# Patient Record
Sex: Male | Born: 1950 | Hispanic: No | Marital: Single | Smoking: Never smoker
Health system: Southern US, Community
[De-identification: ages and names within clinical notes are randomized; demographics above are authoritative.]

## PROBLEM LIST (undated history)

## (undated) DIAGNOSIS — Z9289 Personal history of other medical treatment: Secondary | ICD-10-CM

## (undated) DIAGNOSIS — N529 Male erectile dysfunction, unspecified: Secondary | ICD-10-CM

## (undated) DIAGNOSIS — R972 Elevated prostate specific antigen [PSA]: Secondary | ICD-10-CM

## (undated) DIAGNOSIS — J479 Bronchiectasis, uncomplicated: Secondary | ICD-10-CM

## (undated) DIAGNOSIS — E785 Hyperlipidemia, unspecified: Secondary | ICD-10-CM

## (undated) DIAGNOSIS — Z8042 Family history of malignant neoplasm of prostate: Secondary | ICD-10-CM

## (undated) HISTORY — DX: Personal history of other medical treatment: Z92.89

## (undated) HISTORY — DX: Bronchiectasis, uncomplicated: J47.9

## (undated) HISTORY — DX: Hyperlipidemia, unspecified: E78.5

## (undated) HISTORY — DX: Family history of malignant neoplasm of prostate: Z80.42

## (undated) HISTORY — PX: NO PAST SURGERIES: SHX2092

## (undated) HISTORY — DX: Elevated prostate specific antigen (PSA): R97.20

## (undated) HISTORY — DX: Male erectile dysfunction, unspecified: N52.9

---

## 2002-12-20 ENCOUNTER — Ambulatory Visit (HOSPITAL_COMMUNITY): Admission: RE | Admit: 2002-12-20 | Discharge: 2002-12-20 | Payer: Self-pay | Admitting: Gastroenterology

## 2003-04-29 ENCOUNTER — Emergency Department (HOSPITAL_COMMUNITY): Admission: EM | Admit: 2003-04-29 | Discharge: 2003-04-30 | Payer: Self-pay

## 2012-09-28 DIAGNOSIS — Z9289 Personal history of other medical treatment: Secondary | ICD-10-CM | POA: Insufficient documentation

## 2012-09-28 HISTORY — DX: Personal history of other medical treatment: Z92.89

## 2013-10-12 ENCOUNTER — Encounter: Payer: Self-pay | Admitting: Cardiology

## 2013-10-12 DIAGNOSIS — R011 Cardiac murmur, unspecified: Secondary | ICD-10-CM | POA: Insufficient documentation

## 2013-10-12 HISTORY — DX: Cardiac murmur, unspecified: R01.1

## 2015-02-06 ENCOUNTER — Institutional Professional Consult (permissible substitution): Payer: Self-pay | Admitting: Internal Medicine

## 2015-02-06 ENCOUNTER — Encounter: Payer: Self-pay | Admitting: Internal Medicine

## 2015-02-06 ENCOUNTER — Ambulatory Visit (INDEPENDENT_AMBULATORY_CARE_PROVIDER_SITE_OTHER): Payer: Managed Care, Other (non HMO) | Admitting: Internal Medicine

## 2015-02-06 VITALS — BP 104/60 | HR 80 | Ht 68.0 in | Wt 124.0 lb

## 2015-02-06 DIAGNOSIS — J479 Bronchiectasis, uncomplicated: Secondary | ICD-10-CM

## 2015-02-06 DIAGNOSIS — J471 Bronchiectasis with (acute) exacerbation: Secondary | ICD-10-CM | POA: Diagnosis not present

## 2015-02-06 HISTORY — DX: Bronchiectasis, uncomplicated: J47.9

## 2015-02-06 MED ORDER — MOMETASONE FURO-FORMOTEROL FUM 100-5 MCG/ACT IN AERO: INHALATION_SPRAY | RESPIRATORY_TRACT | Status: DC

## 2015-02-06 MED ORDER — AMOXICILLIN-POT CLAVULANATE 875-125 MG PO TABS: 1.0000 | ORAL_TABLET | Freq: Two times a day (BID) | ORAL | Status: DC

## 2015-02-06 NOTE — Progress Notes (Signed)
   Subjective:    Patient ID: Henry Roth, male    DOB: Oct 13, 1950,     MRN: [id number]  HPI  64 yo [place] male dx bronchiectasis early 2000s with reurrent flares once in Dec 2015 and then again 12/2014 referred to pulmonary clinic 02/06/2015 by Dr Catha Gosselin  p   course of  Levaquin   Completed x 10 days on 01/30/15       02/06/2015 1st [facility] office visit/ Wert   Chief Complaint  Patient presents with  . Pulmonary Consult    Referred by Dr. Catha Gosselin. Pt c/o cough for the past 5-6 yrs, worse x 1 yr. Cough is prod with bloody sputum.    cough was much better p 10 levaquin then worse again since 02/03/15  With yellow and  Bloody mucus esp in am but no assoc sob. Pattern has been recurrent sev times yearly x 15 years or so. Assoc with minimal actual hemoptysis and no sob or cp/ worse in cold weather otherwise no obvious   patterns in day to day or daytime variabilty or assoc  chest tightness, subjective wheeze overt sinus or hb symptoms. No unusual exp hx or h/o childhood pna/ asthma or knowledge of premature birth.  Sleeping ok without nocturnal  or early am exacerbation  of respiratory  c/o's or need for noct saba. Also denies any obvious fluctuation of symptoms with weather or environmental changes or other aggravating or alleviating factors except as outlined above   Current Medications, Allergies, Complete Past Medical History, Past Surgical History, Family History, and Social History were reviewed in [employer] record.            Review of Systems  Constitutional: Negative for fever, chills, activity change, appetite change and unexpected weight change.  HENT: Negative for congestion, dental problem, postnasal drip, rhinorrhea, sneezing, sore throat, trouble swallowing and voice change.   Eyes: Negative for visual disturbance.  Respiratory: Positive for cough. Negative for choking and shortness of breath.   Cardiovascular: Negative for chest  pain and leg swelling.  Gastrointestinal: Negative for nausea, vomiting and abdominal pain.  Genitourinary: Negative for difficulty urinating.  Musculoskeletal: Negative for arthralgias.  Skin: Negative for rash.  Psychiatric/Behavioral: Negative for behavioral problems and confusion.       Objective:   Physical Exam   amb ethopian male nad very hoarse/ nasal  Tone to vocie   Wt Readings from Last 3 Encounters:  02/06/15 124 lb (56.246 kg)    Vital signs reviewed   HEENT: nl dentition, turbinates, and orophanx. Nl external ear canals without cough reflex   NECK :  without JVD/Nodes/TM/ nl carotid upstrokes bilaterally   LUNGS: no acc muscle use, insp / exp rhonchi bilaterally    CV:  RRR  no s3 or murmur or increase in P2, no edema   ABD:  soft and nontender with nl excursion in the supine position. No bruits or organomegaly, bowel sounds nl  MS:  warm without deformities, calf tenderness, cyanosis or clubbing  SKIN: warm and dry without lesions    NEURO:  alert, approp, no deficits    I personally reviewed images and agree with radiology impression as follows:  CXR:  01/19/15 Increased density both lung bases              Assessment & Plan:

## 2015-02-06 NOTE — Assessment & Plan Note (Signed)
EPIC review indicates no CT on record but I suspect strongly that he not only has bronchiectasis but airflow obstruction clinically so he has obstructive bronchiectasis and is at risk for resistant organisms and MAI infection. What he needs now is better mucociliary stimulation and I recommended a course of dulera 100 2 puffs twice a day for this and treatment for what may be underlying sinusitis as well as bronchiectasis with Augmentinx 10 days  and then return to schedule sinus and high-resolution CT scans to complete the workup.  If he does have significant airflow obstruction he also needs palpable 1 antitrypsin genotyping, although I understand this would be rare in the African or  Asian population.  I had an extended discussion with the patient reviewing all relevant studies completed to date and  lasting 35 m   Each maintenance medication was reviewed in detail including most importantly the difference between maintenance and prns and under what circumstances the prns are to be triggered using an action plan format that is not reflected in the computer generated alphabetically organized AVS.    Please see instructions for details which were reviewed in writing and the patient given a copy highlighting the part that I personally wrote and discussed at today's ov.

## 2015-02-06 NOTE — Patient Instructions (Addendum)
Augmentin 875 mg take one pill twice daily  X 10 days - take at breakfast and supper with large glass of water.  It would help reduce the usual side effects (diarrhea and yeast infections) if you ate cultured yogurt at lunch.   Dulera 100 Take 2 puffs first thing in am and then another 2 puffs about 12 hours later.   Work on inhaler technique:  relax and gently blow all the way out then take a nice smooth deep breath back in, triggering the inhaler at same time you start breathing in.  Hold for up to 5 seconds if you can. Blow out thru nose. Rinse and gargle with water when done  Bronchiectasis =   you have scarring of your bronchial tubes which means that they don't function perfectly normally and mucus tends to pool in certain areas of your lung which can cause pneumonia and further scarring of your lung and bronchial tubes  Whenever you develop cough congestion take mucinex or mucinex dm > these will help keep the mucus loose and flowing but if your condition worsens you need to seek help immediately preferably here or somewhere inside the [facility] system to compare xrays ( worse = darker or bloody mucus or pain on breathing in)    Please schedule a follow up office visit in 2 weeks, sooner if needed  Late add Needs sinus and HRCT chest to complete the w/u

## 2015-02-20 ENCOUNTER — Encounter: Payer: Self-pay | Admitting: Internal Medicine

## 2015-02-20 ENCOUNTER — Ambulatory Visit (INDEPENDENT_AMBULATORY_CARE_PROVIDER_SITE_OTHER): Payer: Managed Care, Other (non HMO) | Admitting: Internal Medicine

## 2015-02-20 DIAGNOSIS — J471 Bronchiectasis with (acute) exacerbation: Secondary | ICD-10-CM

## 2015-02-20 DIAGNOSIS — J479 Bronchiectasis, uncomplicated: Secondary | ICD-10-CM

## 2015-02-20 MED ORDER — MOMETASONE FURO-FORMOTEROL FUM 100-5 MCG/ACT IN AERO: INHALATION_SPRAY | RESPIRATORY_TRACT | Status: DC

## 2015-02-20 NOTE — Progress Notes (Signed)
Subjective:    Patient ID: Henry Roth, male    DOB: 06/06/1950,     MRN: [id number]    Brief patient profile:  64 yo [place] male dx bronchiectasis early 2000s with recurrent flares once in Dec 2015 and then again 12/2014 referred to pulmonary clinic 02/06/2015 by Dr Catha Gosselin  p course of  Levaquin   Completed x 10 days on 01/30/15       02/06/2015 1st [facility] office visit/ Amarilis Belflower   Chief Complaint  Patient presents with  . Pulmonary Consult    Referred by Dr. Catha Gosselin. Pt c/o cough for the past 5-6 yrs, worse x 1 yr. Cough is prod with bloody sputum.    cough was much better p 10 levaquin then worse again since 02/03/15  With yellow and  Bloody mucus esp in am but no assoc sob. Pattern has been recurrent sev times yearly x 15 years or so. Assoc with minimal actual hemoptysis and no sob or cp/ worse in cold weather otherwise no obvious   patterns in day to day or daytime variabilty or assoc  chest tightness, subjective wheeze overt sinus or hb symptoms. No unusual exp hx or h/o childhood pna/ asthma or knowledge of premature birth. rec Augmentin 875 mg take one pill twice daily  X 10 days - Dulera 100 Take 2 puffs first thing in am and then another 2 puffs about 12 hours later.  Work on inhaler technique:        02/20/2015  f/u ov/Rhyanna Sorce re: suspected obst bronchiectasis / on just dulera 100 2bid  Chief Complaint  Patient presents with  . Follow-up    Pt states cough is much improved since the last visit. No new co's today.      no purulent or bloody mucus, just mucoid x sev tsp mostly in ams  Not limited by breathing from desired activities  But not aerobically active   No obvious day to day or daytime variability or assoc   cp or chest tightness, subjective wheeze or overt   hb symptoms. No unusual exp hx or h/o childhood pna/ asthma or knowledge of premature birth.  Sleeping ok without nocturnal  or early am exacerbation  of respiratory  c/o's or need for noct  saba. Also denies any obvious fluctuation of symptoms with weather or environmental changes or other aggravating or alleviating factors except as outlined above   Current Medications, Allergies, Complete Past Medical History, Past Surgical History, Family History, and Social History were reviewed in [employer] record.  ROS  The following are not active complaints unless bolded sore throat, dysphagia, dental problems, itching, sneezing,  nasal congestion or excess/ purulent secretions, ear ache,   fever, chills, sweats, unintended wt loss, classically pleuritic or exertional cp, hemoptysis,  orthopnea pnd or leg swelling, presyncope, palpitations, abdominal pain, anorexia, nausea, vomiting, diarrhea  or change in bowel or bladder habits, change in stools or urine, dysuria,hematuria,  rash, arthralgias, visual complaints, headache, numbness, weakness or ataxia or problems with walking or coordination,  change in mood/affect or memory.               Objective:   Physical Exam   amb ethopian male nad mildly hoarse/ nasal  Tone to vocie   Wt Readings from Last 3 Encounters:  02/20/15 122 lb 6.4 oz (55.52 kg)  02/06/15 124 lb (56.246 kg)    Vital signs reviewed    HEENT: nl dentition, turbinates, and orophanx.  Nl external ear canals without cough reflex   NECK :  without JVD/Nodes/TM/ nl carotid upstrokes bilaterally   LUNGS: no acc muscle use,   Min insp and exp rhonchi with coarse bs bilaterally    CV:  RRR  no s3 or murmur or increase in P2, no edema   ABD:  soft and nontender with nl excursion in the supine position. No bruits or organomegaly, bowel sounds nl  MS:  warm without deformities, calf tenderness, cyanosis or clubbing  SKIN: warm and dry without lesions    NEURO:  alert, approp, no deficits    I personally reviewed images and agree with radiology impression as follows:  CXR:  01/19/15 Increased density both lung bases                Assessment & Plan:

## 2015-02-20 NOTE — Patient Instructions (Addendum)
rec continue dulera 100 Take 2 puffs first thing in am and then another 2 puffs about 12 hours later.   Please see patient coordinator before you leave today  to schedule sinus and chest CT   Please schedule a follow up office visit in 6 weeks, call sooner if needed with pfts on return

## 2015-02-21 ENCOUNTER — Encounter: Payer: Self-pay | Admitting: Internal Medicine

## 2015-02-21 NOTE — Assessment & Plan Note (Signed)
-   02/20/2015 p extensive coaching HFA effectiveness =    75%  - HRCT chest  02/27/15  - Sinus CT 02/27/15   Doing much better on dulera 100 2 puffs every 12 hours. This is probably helping both airflow and mucociliary function and he should continue with pending return here for PFT and above CT's to complete the w/u.   I had an extended discussion with the patient reviewing all relevant studies completed to date and  lasting 15 to 20 minutes of a 25 minute visit    Each maintenance medication was reviewed in detail including most importantly the difference between maintenance and prns and under what circumstances the prns are to be triggered using an action plan format that is not reflected in the computer generated alphabetically organized AVS.    Please see instructions for details which were reviewed in writing and the patient given a copy highlighting the part that I personally wrote and discussed at today's ov.

## 2015-02-27 ENCOUNTER — Other Ambulatory Visit: Payer: Managed Care, Other (non HMO)

## 2015-02-27 ENCOUNTER — Inpatient Hospital Stay: Admission: RE | Admit: 2015-02-27 | Payer: Managed Care, Other (non HMO) | Source: Ambulatory Visit

## 2015-03-02 ENCOUNTER — Other Ambulatory Visit: Payer: Managed Care, Other (non HMO)

## 2015-03-02 ENCOUNTER — Inpatient Hospital Stay: Admission: RE | Admit: 2015-03-02 | Payer: Managed Care, Other (non HMO) | Source: Ambulatory Visit

## 2015-03-06 ENCOUNTER — Other Ambulatory Visit: Payer: Self-pay | Admitting: Internal Medicine

## 2015-03-06 ENCOUNTER — Ambulatory Visit (INDEPENDENT_AMBULATORY_CARE_PROVIDER_SITE_OTHER)
Admission: RE | Admit: 2015-03-06 | Discharge: 2015-03-06 | Disposition: A | Discharge status: Home / Self Care | Payer: Managed Care, Other (non HMO) | Source: Ambulatory Visit | Attending: Internal Medicine | Admitting: Internal Medicine

## 2015-03-06 DIAGNOSIS — J471 Bronchiectasis with (acute) exacerbation: Secondary | ICD-10-CM

## 2015-03-06 DIAGNOSIS — J479 Bronchiectasis, uncomplicated: Secondary | ICD-10-CM

## 2015-03-06 DIAGNOSIS — J328 Other chronic sinusitis: Secondary | ICD-10-CM

## 2015-03-06 DIAGNOSIS — J329 Chronic sinusitis, unspecified: Secondary | ICD-10-CM

## 2015-03-06 HISTORY — DX: Chronic sinusitis, unspecified: J32.9

## 2015-03-06 MED ORDER — AMOXICILLIN-POT CLAVULANATE 875-125 MG PO TABS: 1.0000 | ORAL_TABLET | Freq: Two times a day (BID) | ORAL | Status: DC

## 2015-03-06 NOTE — Progress Notes (Signed)
Quick Note:  Spoke with pt and notified of results per Dr. Wert. Pt verbalized understanding and denied any questions.  ______ 

## 2015-03-07 NOTE — Progress Notes (Signed)
Quick Note:  LMTCB ______ 

## 2015-03-08 ENCOUNTER — Telehealth: Payer: Self-pay | Admitting: Internal Medicine

## 2015-03-08 NOTE — Telephone Encounter (Signed)
Called spoke with pt. He reports he is not able to have the repeat sinus scan done. He reports he can't afford this. He is now responsible to pay for $500 for the recent scans he had done. He is going to take the augmentin. Please advise MW thanks

## 2015-03-08 NOTE — Telephone Encounter (Signed)
As long as he has a complete response that's fine, but be sure he has a f/u with me to regroup and discuss longterm rx options when he completes the abx

## 2015-03-09 NOTE — Telephone Encounter (Addendum)
lmtcb x1 for pt Per Henry Roth, this scan has been denied by his insurance company.

## 2015-03-12 NOTE — Telephone Encounter (Signed)
lmomtcb x 2  

## 2015-03-13 NOTE — Telephone Encounter (Signed)
lmomtcb for pt 

## 2015-03-16 NOTE — Telephone Encounter (Signed)
Pt notified. Pt scheduled to see MW 04/03/15 Nothing further needed.

## 2015-03-16 NOTE — Telephone Encounter (Signed)
lmtcb

## 2015-03-27 ENCOUNTER — Inpatient Hospital Stay: Admission: RE | Admit: 2015-03-27 | Payer: Managed Care, Other (non HMO) | Source: Ambulatory Visit

## 2015-04-03 ENCOUNTER — Ambulatory Visit (INDEPENDENT_AMBULATORY_CARE_PROVIDER_SITE_OTHER): Payer: Managed Care, Other (non HMO) | Admitting: Internal Medicine

## 2015-04-03 ENCOUNTER — Encounter: Payer: Self-pay | Admitting: Internal Medicine

## 2015-04-03 VITALS — BP 110/60 | HR 78 | Ht 68.0 in | Wt 122.0 lb

## 2015-04-03 DIAGNOSIS — J471 Bronchiectasis with (acute) exacerbation: Secondary | ICD-10-CM

## 2015-04-03 DIAGNOSIS — J479 Bronchiectasis, uncomplicated: Secondary | ICD-10-CM

## 2015-04-03 DIAGNOSIS — J328 Other chronic sinusitis: Secondary | ICD-10-CM | POA: Diagnosis not present

## 2015-04-03 LAB — PULMONARY FUNCTION TEST
DL/VA % PRED: 112 %
DL/VA: 5.06 ml/min/mmHg/L
DLCO unc % pred: 74 %
DLCO unc: 22.09 ml/min/mmHg
FEF 25-75 Post: 1.31 L/sec
FEF 25-75 Pre: 1.66 L/sec
FEF2575-%CHANGE-POST: -21 %
FEF2575-%PRED-PRE: 61 %
FEF2575-%Pred-Post: 48 %
FEV1-%CHANGE-POST: -10 %
FEV1-%PRED-POST: 63 %
FEV1-%Pred-Pre: 70 %
FEV1-PRE: 2.32 L
FEV1-Post: 2.09 L
FEV1FVC-%CHANGE-POST: -9 %
FEV1FVC-%Pred-Pre: 97 %
FEV6-%CHANGE-POST: 0 %
FEV6-%Pred-Post: 76 %
FEV6-%Pred-Pre: 76 %
FEV6-PRE: 3.18 L
FEV6-Post: 3.17 L
FEV6FVC-%PRED-PRE: 105 %
FEV6FVC-%Pred-Post: 105 %
FVC-%CHANGE-POST: 0 %
FVC-%PRED-PRE: 72 %
FVC-%Pred-Post: 72 %
FVC-POST: 3.17 L
FVC-PRE: 3.18 L
POST FEV1/FVC RATIO: 66 %
Post FEV6/FVC ratio: 100 %
Pre FEV1/FVC ratio: 73 %
Pre FEV6/FVC Ratio: 100 %
RV % pred: 117 %
RV: 2.57 L
TLC % pred: 83 %
TLC: 5.51 L

## 2015-04-03 MED ORDER — AMOXICILLIN-POT CLAVULANATE 875-125 MG PO TABS: 1.0000 | ORAL_TABLET | Freq: Two times a day (BID) | ORAL | Status: DC

## 2015-04-03 NOTE — Progress Notes (Signed)
PFT done today. 

## 2015-04-03 NOTE — Progress Notes (Signed)
Subjective:    Patient ID: Henry Roth, male    DOB: Aug 10, 1950,     MRN: [id number]    Brief patient profile:  64 yo [place] male dx bronchiectasis early 2000s with recurrent flares once in Dec 2015 and then again 12/2014 referred to pulmonary clinic 02/06/2015 by Dr Catha Gosselin  p course of  Levaquin   Completed x 10 days on 01/30/15       History of Present Illness  02/06/2015 1st [facility] office visit/ Niall Illes   Chief Complaint  Patient presents with  . Pulmonary Consult    Referred by Dr. Catha Gosselin. Pt c/o cough for the past 5-6 yrs, worse x 1 yr. Cough is prod with bloody sputum.    cough was much better p 10 levaquin then worse again since 02/03/15  With yellow and  Bloody mucus esp in am but no assoc sob. Pattern has been recurrent sev times yearly x 15 years or so.   rec Augmentin 875 mg take one pill twice daily  X 10 days - Dulera 100 Take 2 puffs first thing in am and then another 2 puffs about 12 hours later.  Work on inhaler technique:        02/20/2015  f/u ov/Galen Russman re: suspected obst bronchiectasis / on just dulera 100 2bid  Chief Complaint  Patient presents with  . Follow-up    Pt states cough is much improved since the last visit. No new co's today.  no purulent or bloody mucus, just mucoid x sev tsp mostly in ams rec rec continue dulera 100 Take 2 puffs first thing in am and then another 2 puffs about 12 hours later.  See patient coordinator before you leave today  to schedule sinus and chest CT > c/ w bronchiectasis/ sinusitis rx augmentin x 20 days, insurance denied repeat sinus scan to assure clearing but all nasal symptoms resolved      04/03/2015  f/u ov/Evva Din re:  Obstructive bronchiectasis  Chief Complaint  Patient presents with  . Follow-up    PFT done today. Pt states that his cough is stable. No new co's today.     Not limited by breathing from desired activities  But not aerobically active   No obvious day to day or daytime  variability or assoc   cp or chest tightness, subjective wheeze or overt   hb symptoms. No unusual exp hx or h/o childhood pna/ asthma or knowledge of premature birth.  Sleeping ok without nocturnal  or early am exacerbation  of respiratory  c/o's or need for noct saba. Also denies any obvious fluctuation of symptoms with weather or environmental changes or other aggravating or alleviating factors except as outlined above   Current Medications, Allergies, Complete Past Medical History, Past Surgical History, Family History, and Social History were reviewed in [employer] record.  ROS  The following are not active complaints unless bolded sore throat, dysphagia, dental problems, itching, sneezing,  nasal congestion or excess/ purulent secretions, ear ache,   fever, chills, sweats, unintended wt loss, classically pleuritic or exertional cp, hemoptysis,  orthopnea pnd or leg swelling, presyncope, palpitations, abdominal pain, anorexia, nausea, vomiting, diarrhea  or change in bowel or bladder habits, change in stools or urine, dysuria,hematuria,  rash, arthralgias, visual complaints, headache, numbness, weakness or ataxia or problems with walking or coordination,  change in mood/affect or memory.               Objective:  Physical Exam   amb ethopian male nad   04/03/2015        122   02/20/15 122 lb 6.4 oz (55.52 kg)  02/06/15 124 lb (56.246 kg)    Vital signs reviewed    HEENT: nl dentition, turbinates, and orophanx. Nl external ear canals without cough reflex   NECK :  without JVD/Nodes/TM/ nl carotid upstrokes bilaterally   LUNGS: no acc muscle use,   Min insp and exp rhonchi with coarse bs bilaterally - no cough on insp or exp   CV:  RRR  no s3 or murmur or increase in P2, no edema   ABD:  soft and nontender with nl excursion in the supine position. No bruits or organomegaly, bowel sounds nl  MS:  warm without deformities, calf tenderness, cyanosis or  clubbing  SKIN: warm and dry without lesions    NEURO:  alert, approp, no deficits    I personally reviewed images and agree with radiology impression as follows:  HRCT CHEST 03/06/15 1. Patchy bilateral peribronchovascular nodularity and ground-glass with focal volume loss and bronchiectasis in the right lower lobe. Debris in lower lobe bronchi bilaterally. Findings may be due to a chronic atypical infectious process. The possibility of chronic/repeated aspiration should also be considered. 2. No evidence of interstitial lung disease.        Assessment & Plan:

## 2015-04-03 NOTE — Patient Instructions (Addendum)
For any flare of nasal symtpoms or chest symptoms where you have nasty mucus > Augmentin 875 mg take one pill twice daily  X 10 days - take at breakfast and supper with large glass of water.  It would help reduce the usual side effects (diarrhea and yeast infections) if you ate cultured yogurt at lunch.   This prescription is refillable x one year  Work on inhaler technique:  relax and gently blow all the way out then take a nice smooth deep breath back in, triggering the inhaler at same time you start breathing in.  Hold for up to 5 seconds if you can. Blow out thru nose. Rinse and gargle with water when done  I would call Dr Fredirick MaudlinLittle's office to be sure you get Prevnar 13 now and Pneumovax at age 89   Please schedule a follow up visit in 6  months but call sooner if needed

## 2015-04-04 ENCOUNTER — Encounter: Payer: Self-pay | Admitting: Internal Medicine

## 2015-04-04 NOTE — Assessment & Plan Note (Signed)
See sinus CT 03/13/15 > rx augmentin x 20 days > symptoms resolved/ insurance denied f/u scan

## 2015-04-04 NOTE — Assessment & Plan Note (Signed)
-   02/20/2015 trial of dulera 100 2bid - HRCT chest  03/06/15  1. Patchy bilateral peribronchovascular nodularity and ground-glass with focal volume loss and bronchiectasis in the right lower lobe. Debris in lower lobe bronchi bilaterally.  - Sinus CT  ? Acute on chronic dz > Augmentin 875 mg take one pill twice daily  X 20 days then repeat > denied by insurance  - PFT's  04/03/2015  FEV1 2.09 (63 % ) ratio 66  p no % improvement from saba (p am dulera) with DLCO  74 % corrects to 112 % for alv volume     - 04/03/2015  extensive coaching HFA effectiveness =    50% (trigger late)    Well compensated p prolonged abx.  I had an extended discussion with the patient reviewing all relevant studies completed to date and  lasting 15 to 20 minutes of a 25 minute visit  Discussed that he has poor mucociliary dysfunction and airflow obst related to bronchiectasis but better on dulera 100 2bid despite very poor hfa technique that will need to be re-taught at each ov.  Also needs prevnar now and pneumovax in one year     Each maintenance medication was reviewed in detail including most importantly the difference between maintenance and prns and under what circumstances the prns are to be triggered using an action plan format that is not reflected in the computer generated alphabetically organized AVS.    Please see instructions for details which were reviewed in writing and the patient given a copy highlighting the part that I personally wrote and discussed at today's ov.

## 2015-10-02 ENCOUNTER — Ambulatory Visit (INDEPENDENT_AMBULATORY_CARE_PROVIDER_SITE_OTHER): Payer: Managed Care, Other (non HMO) | Admitting: Internal Medicine

## 2015-10-02 ENCOUNTER — Ambulatory Visit (INDEPENDENT_AMBULATORY_CARE_PROVIDER_SITE_OTHER)
Admission: RE | Admit: 2015-10-02 | Discharge: 2015-10-02 | Disposition: A | Discharge status: Home / Self Care | Payer: Managed Care, Other (non HMO) | Source: Ambulatory Visit | Attending: Internal Medicine | Admitting: Internal Medicine

## 2015-10-02 ENCOUNTER — Encounter: Payer: Self-pay | Admitting: Internal Medicine

## 2015-10-02 VITALS — BP 108/62 | HR 61 | Ht 68.0 in | Wt 122.0 lb

## 2015-10-02 DIAGNOSIS — J471 Bronchiectasis with (acute) exacerbation: Secondary | ICD-10-CM | POA: Diagnosis not present

## 2015-10-02 DIAGNOSIS — J479 Bronchiectasis, uncomplicated: Secondary | ICD-10-CM

## 2015-10-02 NOTE — Patient Instructions (Signed)
Work on inhaler technique:  relax and gently blow all the way out then take a nice smooth deep breath back in, triggering the inhaler at same time you start breathing in.  Hold for up to 5 seconds if you can. Blow out thru nose. Rinse and gargle with water when done   [facility] 100 until having cough/ wheeze/ short of breath start back immediately at onset and dose with Take 2 puffs first thing in am and then another 2 puffs about 12 hours later.      If you are satisfied with your treatment plan,  let your doctor know and he/she can either refill your medications or you can return here when your prescription runs out.     If in any way you are not 100% satisfied,  please tell us.  If 100% better, tell your friends!  Pulmonary follow up is as needed

## 2015-10-02 NOTE — Progress Notes (Signed)
Subjective:    Patient ID: Henry Roth, male    DOB: 06/16/1950,     MRN: [id number]    Brief patient profile:  65 yo [place] male dx bronchiectasis early 2000s with recurrent flares once in Dec 2015 and then again 12/2014 referred to pulmonary clinic 02/06/2015 by Dr Catha Gosselin  p course of  Levaquin   Completed x 10 days on 01/30/15       History of Present Illness  02/06/2015 1st [place] office visit/ Henry Roth   Chief Complaint  Patient presents with  . Pulmonary Consult    Referred by Dr. Catha Gosselin. Pt c/o cough for the past 5-6 yrs, worse x 1 yr. Cough is prod with bloody sputum.    cough was much better p 10 levaquin then worse again since 02/03/15  With yellow and  Bloody mucus esp in am but no assoc sob. Pattern has been recurrent sev times yearly x 15 years or so.   rec Augmentin 875 mg take one pill twice daily  X 10 days - Dulera 100 Take 2 puffs first thing in am and then another 2 puffs about 12 hours later.  Work on inhaler technique:        02/20/2015  f/u ov/Henry Roth re: suspected obst bronchiectasis / on just dulera 100 2bid  Chief Complaint  Patient presents with  . Follow-up    Pt states cough is much improved since the last visit. No new co's today.  no purulent or bloody mucus, just mucoid x sev tsp mostly in ams rec rec continue dulera 100 Take 2 puffs first thing in am and then another 2 puffs about 12 hours later.  See patient coordinator before you leave today  to schedule sinus and chest CT > c/ w bronchiectasis/ sinusitis rx augmentin x 20 days, insurance denied repeat sinus scan to assure clearing but all nasal symptoms resolved      04/03/2015  f/u ov/Henry Roth re:  Obstructive bronchiectasis  Chief Complaint  Patient presents with  . Follow-up    PFT done today. Pt states that his cough is stable. No new co's today.   rec For any flare of nasal symtpoms or chest symptoms where you have nasty mucus > Augmentin 875 mg take one pill twice  daily  X 10 days - take at breakfast and supper with large glass of water.  It would help reduce the usual side effects (diarrhea and yeast infections) if you ate cultured yogurt at lunch.  This prescription is refillable x one year Work on inhaler technique:   call Dr Fredirick Maudlin office to be sure you get Prevnar 13 now and Pneumovax at age 68       10/02/2015  f/u ov/Henry Roth re: obstructed bronchiectasis/ off dulera x 2 months s flare of sob or cough   Chief Complaint  Patient presents with  . Follow-up    Pt states that he is doing well. Pt c/o occasional cough. Pt denies SOB/wheeze/CP/tightness. Pt has not taken Dulera in several months. Pt does not have a rescue inhaler.   Not limited by breathing from desired activities  Including some jogging  No obvious day to day or daytime variability or assoc excess/ purulent sputum or mucus plugs     cp or chest tightness, subjective wheeze or overt   hb symptoms. No unusual exp hx or h/o childhood pna/ asthma or knowledge of premature birth.  Sleeping ok without nocturnal  or early am exacerbation  of  respiratory  c/o's or need for noct saba. Also denies any obvious fluctuation of symptoms with weather or environmental changes or other aggravating or alleviating factors except as outlined above   Current Medications, Allergies, Complete Past Medical History, Past Surgical History, Family History, and Social History were reviewed in [facility] electronic medical record.  ROS  The following are not active complaints unless bolded sore throat, dysphagia, dental problems, itching, sneezing,  nasal congestion or excess/ purulent secretions, ear ache,   fever, chills, sweats, unintended wt loss, classically pleuritic or exertional cp, hemoptysis,  orthopnea pnd or leg swelling, presyncope, palpitations, abdominal pain, anorexia, nausea, vomiting, diarrhea  or change in bowel or bladder habits, change in stools or urine, dysuria,hematuria,  rash,  arthralgias, visual complaints, headache, numbness, weakness or ataxia or problems with walking or coordination,  change in mood/affect or memory.               Objective:   Physical Exam   amb ethopian male nad all smiles  10/02/2015          123  04/03/2015        122   02/20/15 122 lb 6.4 oz (55.52 kg)  02/06/15 124 lb (56.246 kg)    Vital signs reviewed    HEENT: nl dentition, turbinates, and orophanx. Nl external ear canals without cough reflex   NECK :  without JVD/Nodes/TM/ nl carotid upstrokes bilaterally   LUNGS: no acc muscle use,    Slightly coarse bs bilaterally - no cough on insp or exp   CV:  RRR  no s3 or murmur or increase in P2, no edema   ABD:  soft and nontender with nl excursion in the supine position. No bruits or organomegaly, bowel sounds nl  MS:  warm without deformities, calf tenderness, cyanosis or clubbing  SKIN: warm and dry without lesions    NEURO:  alert, approp, no deficits      CXR PA and Lateral:   10/02/2015 :    I personally reviewed images and agree with radiology impression as follows:    Chronic changes similar to that seen on prior CT consistent with the given clinical history.        Assessment & Plan:

## 2015-10-03 NOTE — Progress Notes (Signed)
Quick Note:  LMTCB ______ 

## 2015-10-03 NOTE — Assessment & Plan Note (Signed)
-   02/20/2015 trial of dulera 100 2bid - HRCT chest  03/06/15  1. Patchy bilateral peribronchovascular nodularity and ground-glass with focal volume loss and bronchiectasis in the right lower lobe. Debris in lower lobe bronchi bilaterally.  - Sinus CT  ? Acute on chronic dz > Augmentin 875 mg take one pill twice daily  X 20 days then repeat > denied by insurance  - PFT's  04/03/2015  FEV1 2.09 (63 % ) ratio 66  p no % improvement from saba (p am dulera) with DLCO  74 % corrects to 112 % for alv volume     - 10/02/2015  extensive coaching HFA effectiveness =    50% (trigger late)   Appears he doesn't really need the dulera which is fortunate as his technique is poor and if he does have more cough/ sob will need a different inhaler - unfortunately the easier DPI's also can cause more cough but should be tried first with last option laba/ics neb if can't  Learn hfa.   I had an extended discussion with the patient reviewing all relevant studies completed to date and  lasting 15 to 20 minutes of a 25 minute visit    Each maintenance medication was reviewed in detail including most importantly the difference between maintenance and prns and under what circumstances the prns are to be triggered using an action plan format that is not reflected in the computer generated alphabetically organized AVS.    Please see instructions for details which were reviewed in writing and the patient given a copy highlighting the part that I personally wrote and discussed at today's ov.   Already advised re pneumonia vaccination / refuses flu shots

## 2015-10-04 ENCOUNTER — Telehealth: Payer: Self-pay | Admitting: Internal Medicine

## 2015-10-04 NOTE — Telephone Encounter (Signed)
Left message for patient to call back  

## 2015-10-05 NOTE — Telephone Encounter (Signed)
Number temporarily unavailable, wcb

## 2015-10-05 NOTE — Progress Notes (Signed)
Quick Note:  Spoke with pt and notified of results per Dr. Wert. Pt verbalized understanding and denied any questions.  ______ 

## 2015-10-09 NOTE — Telephone Encounter (Signed)
Call was regarding cxr results-this has already been reviewed with patient.  Will close message.

## 2016-01-26 IMAGING — CT CT PARANASAL SINUSES LIMITED
1 of 2 series · 13 of 16 positions shown, 17 images · non-contrast
Comparison: None.

CLINICAL DATA: Chronic productive cough for several years.
Sneezing.

EXAM:
CT PARANASAL SINUS LIMITED WITHOUT CONTRAST
TECHNIQUE: Non-contiguous multidetector CT images of the paranasal sinuses were
obtained in a single plane without contrast.

[Series 5: ltd sinus 3.0 h30s · axial · 0.31mm/px · z∈[-64,+12]mm · 13 of 15 slices shown, 17 images]
[im 2/15  brain]
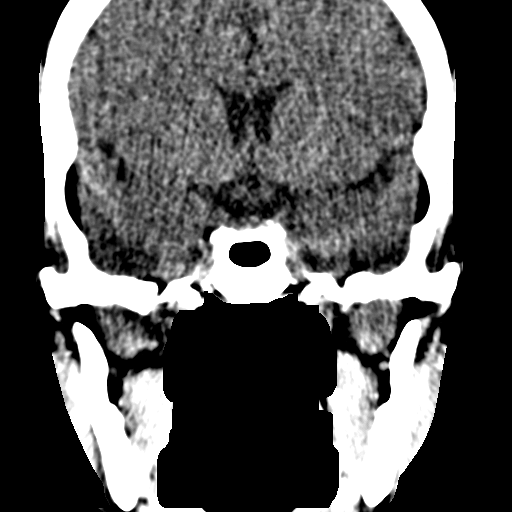
[im 2/15  bone]
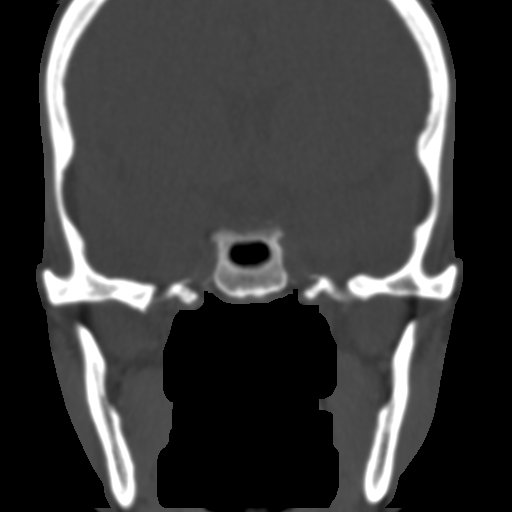
[im 3/15  bone]
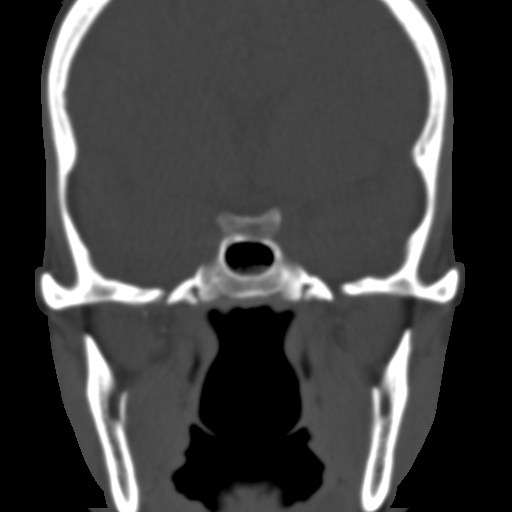
[im 4/15  bone]
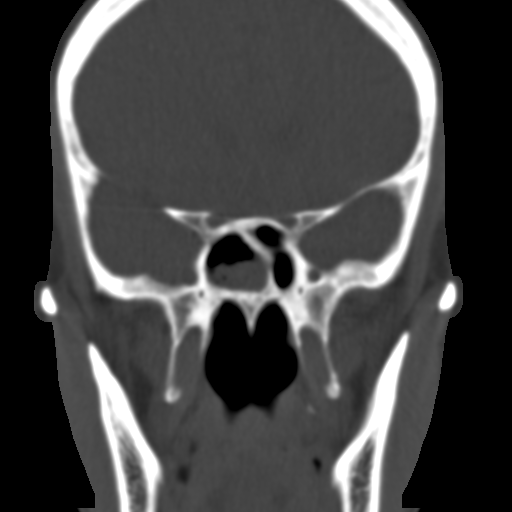
[im 5/15  bone]
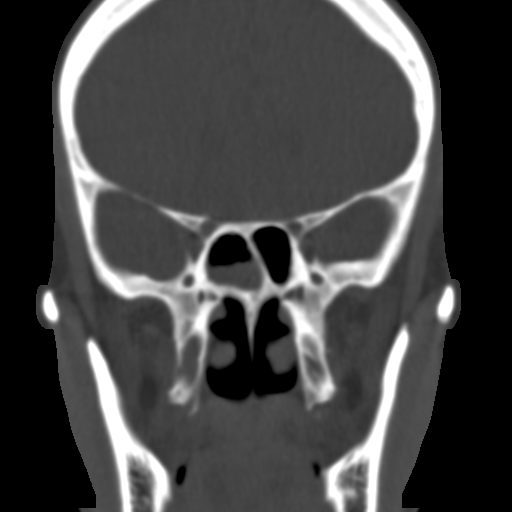
[im 6/15  brain]
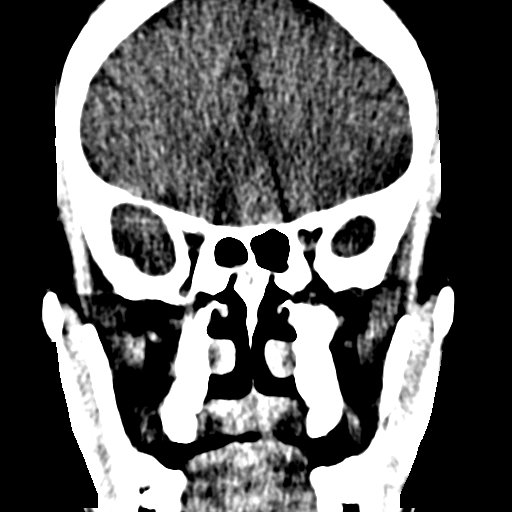
[im 6/15  bone]
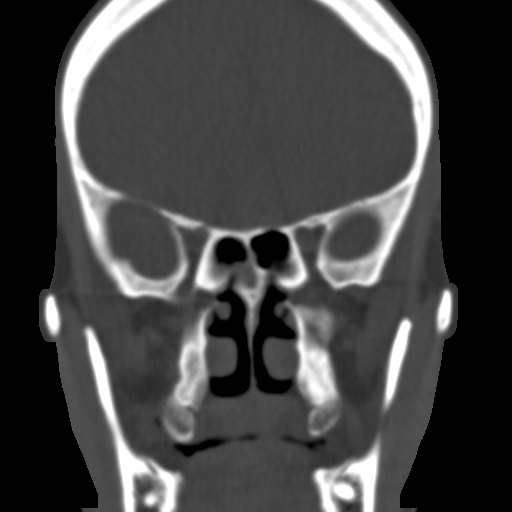
[im 7/15  bone]
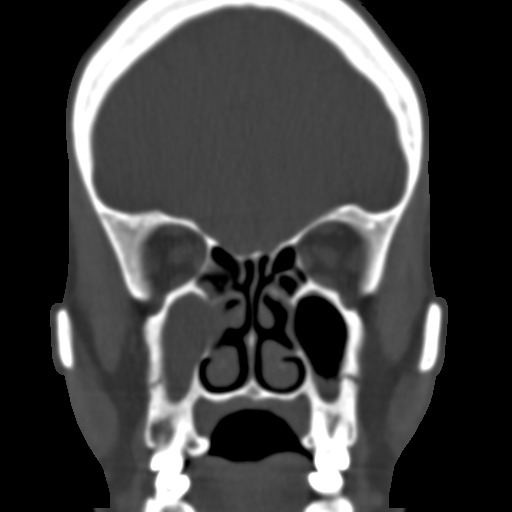
[im 8/15  bone]
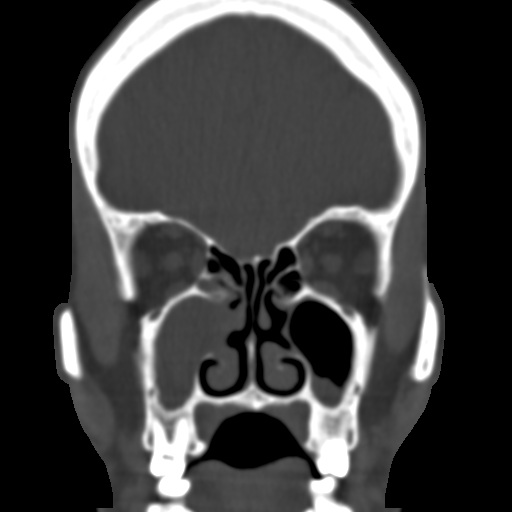
[im 9/15  bone]
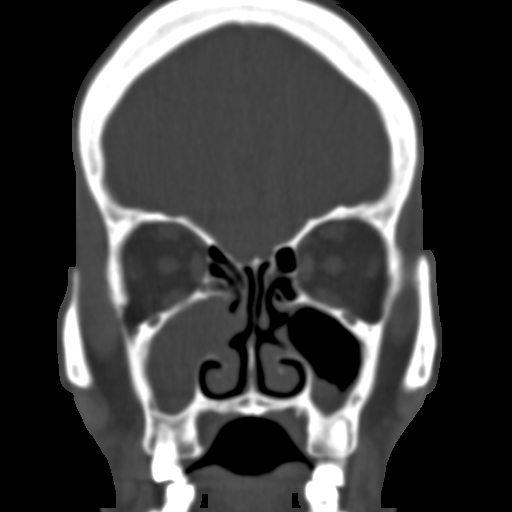
[im 10/15  brain]
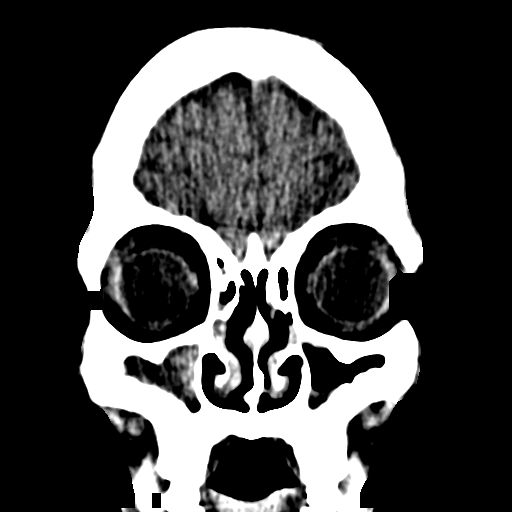
[im 10/15  bone]
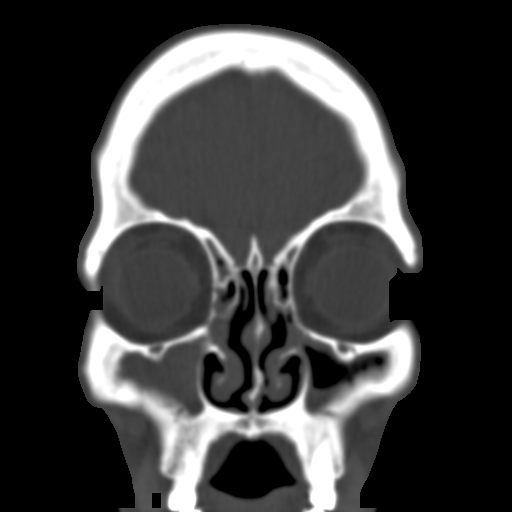
[im 11/15  bone]
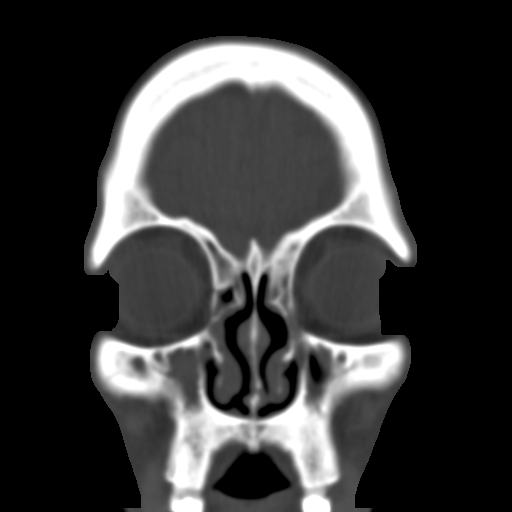
[im 12/15  bone]
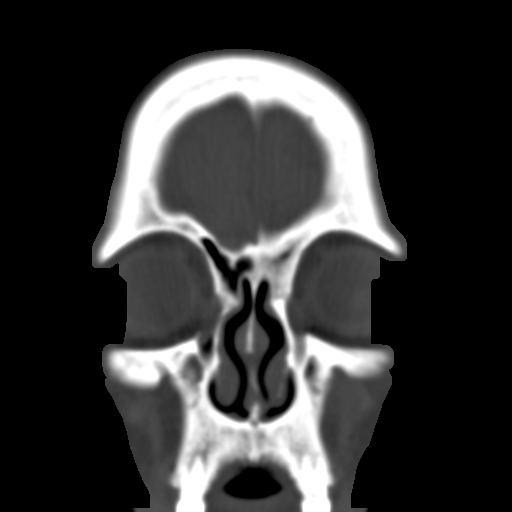
[im 13/15  bone]
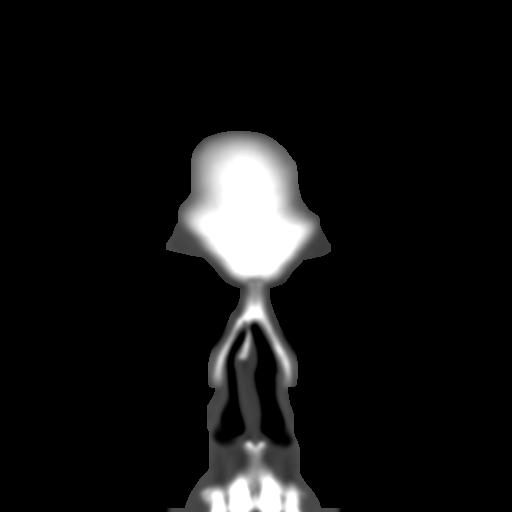
[im 14/15  brain]
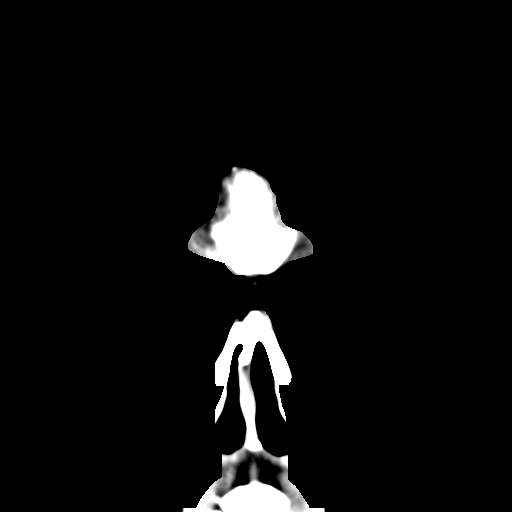
[im 14/15  bone]
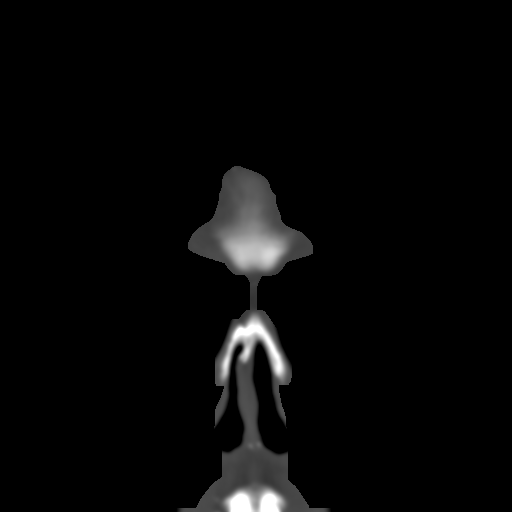

[13 of 16 positions shown; findings below may reference images not displayed]

FINDINGS: Extensive sinus disease. Complete opacification of the right
maxillary sinus. Soft tissue extends medially into the right nasal
cavity. Mucosal thickening within the left maxillary sinus. Frothy
layering material noted within the right sphenoid sinus. Mucosal
thickening in the ethmoid air cells.
IMPRESSION: Extensive chronic sinusitis changes throughout the paranasal sinuses
with complete opacification of the right maxillary sinus and soft
tissue extending into the medial right nasal cavity. Frothy layering
material in the right sphenoid sinus could reflect superimposed
acute sinusitis.

## 2019-03-29 ENCOUNTER — Telehealth (INDEPENDENT_AMBULATORY_CARE_PROVIDER_SITE_OTHER): Payer: Self-pay | Admitting: Cardiology

## 2019-03-29 ENCOUNTER — Encounter: Payer: Self-pay | Admitting: Cardiology

## 2019-03-29 ENCOUNTER — Other Ambulatory Visit: Payer: Self-pay

## 2019-03-29 DIAGNOSIS — R011 Cardiac murmur, unspecified: Secondary | ICD-10-CM

## 2019-03-29 DIAGNOSIS — J328 Other chronic sinusitis: Secondary | ICD-10-CM

## 2019-03-29 DIAGNOSIS — E7800 Pure hypercholesterolemia, unspecified: Secondary | ICD-10-CM | POA: Insufficient documentation

## 2019-03-29 DIAGNOSIS — R0609 Other forms of dyspnea: Secondary | ICD-10-CM

## 2019-03-29 DIAGNOSIS — R0600 Dyspnea, unspecified: Secondary | ICD-10-CM

## 2019-03-29 HISTORY — DX: Dyspnea, unspecified: R06.00

## 2019-03-29 HISTORY — DX: Other forms of dyspnea: R06.09

## 2019-03-29 HISTORY — DX: Pure hypercholesterolemia, unspecified: E78.00

## 2019-03-29 NOTE — Progress Notes (Signed)
Virtual Visit via Video Note   This visit type was conducted due to national recommendations for restrictions regarding the COVID-19 Pandemic (e.g. social distancing) in an effort to limit this patient's exposure and mitigate transmission in our community.  Due to his co-morbid illnesses, this patient is at least at moderate risk for complications without adequate follow up.  This format is felt to be most appropriate for this patient at this time.  All issues noted in this document were discussed and addressed.  A limited physical exam was performed with this format.  Please refer to the patient's chart for his consent to telehealth for [facility].  Evaluation Performed:  Follow-up visit  This visit type was conducted due to national recommendations for restrictions regarding the COVID-19 Pandemic (e.g. social distancing).  This format is felt to be most appropriate for this patient at this time.  All issues noted in this document were discussed and addressed.  No physical exam was performed (except for noted visual exam findings with Video Visits).  Please refer to the patient's chart (MyChart message for video visits and phone note for telephone visits) for the patient's consent to telehealth for [facility].  Date:  03/29/2019  ID: Henry Roth, DOB 11-27-1950, MRN [id number]   Patient Location: [address] [address] [place] [address]   Provider location:   [place] Office  PCP:  Hulan Fess, MD  Cardiologist:  Jenne Campus, MD     Chief Complaint: I have heart murmur  History of Present Illness:    Henry Roth is a 68 y.o. male  who presents via audio/video conferencing for a telehealth visit today.  He is a healthy 68 years old gentleman who exercise on the regular basis about 4 times a week he runs 5 miles, he also does yoga every single morning for about 45 minutes.  He was referred to [place] because on routine physical examination he was discovered to  have heart murmur.  He is completely asymptomatic from that point of view.  He does not have any palpitations, no shortness of breath no swelling of lower extremities no proximal nocturnal dyspnea overall look like he is in very good shape he is also taking care of himself very well.  It is surprising to him that he does have a heart murmur he would like to know what the problem seems to be.   The patient does not have symptoms concerning for COVID-19 infection (fever, chills, cough, or new SHORTNESS OF BREATH).    Prior CV studies:   The following studies were reviewed today:       Past Medical History:  Diagnosis Date  . Bronchiectasis ([place])    of left lower lobe not requiring surgery  . ED (erectile dysfunction)   . Elevated PSA    in march of 2010, treated with Cipro, Dr. Janice Norrie  . Family history of prostate cancer    PTS FATHER  . History of echocardiogram 09/28/12   normal LVF, mild AVSC, mild AI/AS, trivial TR/MR and grade I diastolic dysfunction  . Hyperlipidemia     Past Surgical History:  Procedure Laterality Date  . NO PAST SURGERIES       No outpatient medications have been marked as taking for the 03/29/19 encounter (Telemedicine) with Park Liter, MD.      Family History: The patient's family history includes Liver cancer in his father.   ROS:   Please see the history of present illness.  All other systems reviewed and are negative.   Labs/Other Tests and Data Reviewed:     Recent Labs: No results found for requested labs within last 8760 hours.  Recent Lipid Panel No results found for: CHOL, TRIG, HDL, CHOLHDL, VLDL, LDLCALC, LDLDIRECT    Exam:    Vital Signs:  There were no vitals taken for this visit.    Wt Readings from Last 3 Encounters:  10/02/15 122 lb (55.3 kg)  04/03/15 122 lb (55.3 kg)  02/20/15 122 lb 6.4 oz (55.5 kg)     Well nourished, well developed in no acute distress. Alert awake and x3 with talking over the  video link.  He is not in any distress at the time of interview he is sitting at his home in my office and my home.  He is doing well he is very happy to be able to talk to me  Diagnosis for this visit:   1. Undiagnosed cardiac murmurs   2. Pure hypercholesterolemia   3. Other chronic sinusitis   4. Dyspnea on exertion      ASSESSMENT & PLAN:    1.  Undiagnosed cardiac murmur.  Likely from the physical ability point of view he is doing very well, therefore, I doubt very much will discover something critical here.  The proper way to evaluate this murmur will be to perform echocardiogram which I will do.  He be scheduled to have an echocardiogram shortly. 2.  Dyslipidemia will call primary care physician to get his fasting lipid profile and see if anything need to be done about it. 3.  Chronic sinusitis he seems to be good from that point of view. 4.  Dyspnea on exertion however he is able to run 5 miles with no difficulties therefore I do not think we are facing any dramatic problem here.  Again echocardiogram will be done to clarify his murmur.  When asked him if he can compare himself right now with himself a year ago he told me he does the same there is no deterioration in his condition overall.  COVID-19 Education: The signs and symptoms of COVID-19 were discussed with the patient and how to seek care for testing (follow up with PCP or arrange E-visit).  The importance of social distancing was discussed today.  Patient Risk:   After full review of this patients clinical status, I feel that they are at least moderate risk at this time.  Time:   Today, I have spent 5 minutes with the patient with telehealth technology discussing pt health issues.  I spent 22 minutes reviewing her chart before the visit.  Visit was finished at 10:22 AM.    Medication Adjustments/Labs and Tests Ordered: Current medicines are reviewed at length with the patient today.  Concerns regarding medicines are  outlined above.  No orders of the defined types were placed in this encounter.  Medication changes: No orders of the defined types were placed in this encounter.    Disposition: Follow-up in 3 months  Signed, [name], MD, [facility] 03/29/2019 10:20 AM    [facility]

## 2019-03-29 NOTE — Patient Instructions (Signed)
Medication Instructions:  Your physician recommends that you continue on your current medications as directed. Please refer to the Current Medication list given to you today.  *If you need a refill on your cardiac medications before your next appointment, please call your pharmacy*  Lab Work: None.  If you have labs (blood work) drawn today and your tests are completely normal, you will receive your results only by: Marland Kitchen MyChart Message (if you have MyChart) OR . A paper copy in the mail If you have any lab test that is abnormal or we need to change your treatment, we will call you to review the results.  Testing/Procedures: Your physician has requested that you have an echocardiogram. Echocardiography is a painless test that uses sound waves to create images of your heart. It provides your doctor with information about the size and shape of your heart and how well your heart's chambers and valves are working. This procedure takes approximately one hour. There are no restrictions for this procedure.    Follow-Up: At [facility], you and your health needs are our priority.  As part of our continuing mission to provide you with exceptional heart care, we have created designated Provider Care Teams.  These Care Teams include your primary Cardiologist (physician) and Advanced Practice Providers (APPs -  Physician Assistants and Nurse Practitioners) who all work together to provide you with the care you need, when you need it.  Your next appointment:   3 months  The format for your next appointment:   In Person  Provider:   You may see Dr. Bing Matter. or the following Advanced Practice Provider on your designated Care Team:    Gillian Shields, FNP   Other Instructions   Echocardiogram An echocardiogram is a procedure that uses painless sound waves (ultrasound) to produce an image of the heart. Images from an echocardiogram can provide important information about:  Signs of coronary  artery disease (CAD).  Aneurysm detection. An aneurysm is a weak or damaged part of an artery wall that bulges out from the normal force of blood pumping through the body.  Heart size and shape. Changes in the size or shape of the heart can be associated with certain conditions, including heart failure, aneurysm, and CAD.  Heart muscle function.  Heart valve function.  Signs of a past heart attack.  Fluid buildup around the heart.  Thickening of the heart muscle.  A tumor or infectious growth around the heart valves. Tell a health care provider about:  Any allergies you have.  All medicines you are taking, including vitamins, herbs, eye drops, creams, and over-the-counter medicines.  Any blood disorders you have.  Any surgeries you have had.  Any medical conditions you have.  Whether you are pregnant or may be pregnant. What are the risks? Generally, this is a safe procedure. However, problems may occur, including:  Allergic reaction to dye (contrast) that may be used during the procedure. What happens before the procedure? No specific preparation is needed. You may eat and drink normally. What happens during the procedure?   An IV tube may be inserted into one of your veins.  You may receive contrast through this tube. A contrast is an injection that improves the quality of the pictures from your heart.  A gel will be applied to your chest.  A wand-like tool (transducer) will be moved over your chest. The gel will help to transmit the sound waves from the transducer.  The sound waves will harmlessly bounce  off of your heart to allow the heart images to be captured in real-time motion. The images will be recorded on a computer. The procedure may vary among health care providers and hospitals. What happens after the procedure?  You may return to your normal, everyday life, including diet, activities, and medicines, unless your health care provider tells you not to do  that. Summary  An echocardiogram is a procedure that uses painless sound waves (ultrasound) to produce an image of the heart.  Images from an echocardiogram can provide important information about the size and shape of your heart, heart muscle function, heart valve function, and fluid buildup around your heart.  You do not need to do anything to prepare before this procedure. You may eat and drink normally.  After the echocardiogram is completed, you may return to your normal, everyday life, unless your health care provider tells you not to do that. This information is not intended to replace advice given to you by your health care provider. Make sure you discuss any questions you have with your health care provider. Document Released: 05/09/2000 Document Revised: 09/02/2018 Document Reviewed: 06/14/2016 Elsevier Patient Education  2020 [employer].

## 2019-03-31 ENCOUNTER — Other Ambulatory Visit (HOSPITAL_BASED_OUTPATIENT_CLINIC_OR_DEPARTMENT_OTHER): Payer: Self-pay

## 2019-04-05 ENCOUNTER — Ambulatory Visit (HOSPITAL_BASED_OUTPATIENT_CLINIC_OR_DEPARTMENT_OTHER)
Admission: RE | Admit: 2019-04-05 | Discharge: 2019-04-05 | Disposition: A | Discharge status: Home / Self Care | Payer: Medicare Other | Source: Ambulatory Visit | Attending: Cardiology | Admitting: Cardiology

## 2019-04-05 ENCOUNTER — Other Ambulatory Visit: Payer: Self-pay

## 2019-04-05 DIAGNOSIS — R0600 Dyspnea, unspecified: Secondary | ICD-10-CM | POA: Diagnosis not present

## 2019-04-05 NOTE — Progress Notes (Signed)
  Echocardiogram 2D Echocardiogram has been performed.  Henry Roth 04/05/2019, 8:58 AM

## 2019-07-19 ENCOUNTER — Encounter: Payer: Self-pay | Admitting: Cardiology

## 2019-07-19 ENCOUNTER — Ambulatory Visit (INDEPENDENT_AMBULATORY_CARE_PROVIDER_SITE_OTHER): Payer: Medicare Other | Admitting: Cardiology

## 2019-07-19 ENCOUNTER — Other Ambulatory Visit: Payer: Self-pay

## 2019-07-19 VITALS — BP 120/64 | HR 92 | Ht 68.0 in | Wt 126.8 lb

## 2019-07-19 DIAGNOSIS — R0609 Other forms of dyspnea: Secondary | ICD-10-CM

## 2019-07-19 DIAGNOSIS — R0600 Dyspnea, unspecified: Secondary | ICD-10-CM

## 2019-07-19 DIAGNOSIS — E7800 Pure hypercholesterolemia, unspecified: Secondary | ICD-10-CM | POA: Diagnosis not present

## 2019-07-19 DIAGNOSIS — I350 Nonrheumatic aortic (valve) stenosis: Secondary | ICD-10-CM | POA: Insufficient documentation

## 2019-07-19 HISTORY — DX: Nonrheumatic aortic (valve) stenosis: I35.0

## 2019-07-19 NOTE — Progress Notes (Signed)
Cardiology Office Note:    Date:  07/19/2019   ID:  Henry Roth, DOB 09/20/1950, MRN [id number]  PCP:  Catha Gosselin, MD  Cardiologist:  Gypsy Balsam, MD    Referring MD: Catha Gosselin, MD   No chief complaint on file. Doing well  History of Present Illness:    Henry Roth is a 69 y.o. male he was originally referred to [place] because of heart murmur.  Overall doing very well.  He exercised on the regular basis he lives running and does 5 times a week about 5 miles no difficulties, did not notice any decrease in his ability to exercise.  Echocardiogram done showed mild to moderate aortic stenosis.  He is here today to talk about that.  He does not have any signs and symptoms of significant aortic stenosis, there is no shortness of breath, no passing out, no palpitations or dizziness.  No chest pain.  Past Medical History:  Diagnosis Date  . Bronchiectasis (HCC)    of left lower lobe not requiring surgery  . ED (erectile dysfunction)   . Elevated PSA    in march of 2010, treated with Cipro, Dr. Brunilda Payor  . Family history of prostate cancer    PTS FATHER  . History of echocardiogram 09/28/12   normal LVF, mild AVSC, mild AI/AS, trivial TR/MR and grade I diastolic dysfunction  . Hyperlipidemia     Past Surgical History:  Procedure Laterality Date  . NO PAST SURGERIES      Current Medications: No outpatient medications have been marked as taking for the 07/19/19 encounter (Office Visit) with [name], MD.     Allergies:   Fish oil   Social History   Socioeconomic History  . Marital status: Single    Spouse name: Not on file  . Number of children: Not on file  . Years of education: Not on file  . Highest education level: Not on file  Occupational History  . Not on file  Tobacco Use  . Smoking status: Never Smoker  . Smokeless tobacco: Never Used  Substance and Sexual Activity  . Alcohol use: No    Alcohol/week: 0.0 standard drinks  . Drug use: No    . Sexual activity: Not on file  Other Topics Concern  . Not on file  Social History Narrative  . Not on file   Social Determinants of Health   Financial Resource Strain:   . Difficulty of Paying Living Expenses: Not on file  Food Insecurity:   . Worried About Programme researcher, broadcasting/film/video in the Last Year: Not on file  . Ran Out of Food in the Last Year: Not on file  Transportation Needs:   . Lack of Transportation (Medical): Not on file  . Lack of Transportation (Non-Medical): Not on file  Physical Activity:   . Days of Exercise per Week: Not on file  . Minutes of Exercise per Session: Not on file  Stress:   . Feeling of Stress : Not on file  Social Connections:   . Frequency of Communication with Friends and Family: Not on file  . Frequency of Social Gatherings with Friends and Family: Not on file  . Attends Religious Services: Not on file  . Active Member of Clubs or Organizations: Not on file  . Attends Banker Meetings: Not on file  . Marital Status: Not on file     Family History: The patient's family history includes Liver cancer in his father. ROS:  Please see the history of present illness.    All 14 point review of systems negative except as described per history of present illness  EKGs/Labs/Other Studies Reviewed:    Echocardiogram done in November 2020 showed:   1. Left ventricular ejection fraction, by visual estimation, is 60 to  65%. The left ventricle has normal function. There is no left ventricular  hypertrophy.  2. Left ventricular diastolic parameters are consistent with Grade I  diastolic dysfunction (impaired relaxation).  3. Global right ventricle has normal systolic function.The right  ventricular size is normal. No increase in right ventricular wall  thickness.  4. Left atrial size was normal.  5. Right atrial size was normal.  6. The mitral valve is normal in structure. No evidence of mitral valve  regurgitation. No evidence  of mitral stenosis.  7. The tricuspid valve is normal in structure. Tricuspid valve  regurgitation is trivial.  8. Mild aortic valve annular calcification.  9. The aortic valve is thickened with mild calcification. Trivial aortic  valve regurgitation is visualized. Mild to moderate aortic valve stenosis.  AVA by VTI 1.08cmsq, Mean gradient 13.7 mmHG, Peak velocity 2.47 m/s.  10. The pulmonic valve was normal in structure. Pulmonic valve  regurgitation is trivial.  11. Unable to determine pulmonary artery systolic pressure, no TR jet  spectral display.   Recent Labs: No results found for requested labs within last 8760 hours.  Recent Lipid Panel No results found for: CHOL, TRIG, HDL, CHOLHDL, VLDL, LDLCALC, LDLDIRECT  Physical Exam:    VS:  BP 120/64   Pulse 92   Ht 5\' 8"  (1.727 m)   Wt 126 lb 12.8 oz (57.5 kg)   SpO2 97%   BMI 19.28 kg/m     Wt Readings from Last 3 Encounters:  07/19/19 126 lb 12.8 oz (57.5 kg)  10/02/15 122 lb (55.3 kg)  04/03/15 122 lb (55.3 kg)     GEN:  Well nourished, well developed in no acute distress HEENT: Normal NECK: No JVD; No carotid bruits LYMPHATICS: No lymphadenopathy CARDIAC: RRR, systolic ejection murmur grade 2/6 best heard at the right upper portion of the sternum, early peaking.  S2 is still present, no rubs, no gallops RESPIRATORY:  Clear to auscultation without rales, wheezing or rhonchi  ABDOMEN: Soft, non-tender, non-distended MUSCULOSKELETAL:  No edema; No deformity  SKIN: Warm and dry LOWER EXTREMITIES: no swelling NEUROLOGIC:  Alert and oriented x 3 PSYCHIATRIC:  Normal affect   ASSESSMENT:    1. Pure hypercholesterolemia   2. Nonrheumatic aortic valve stenosis   3. Dyspnea on exertion    PLAN:    In order of problems listed above:  1. Aortic stenosis which is some mild to moderate by echocardiogram done in November.  We spent a great deal of time talking about this issue.  I told him that eventually he will  require aortic valve replacement however it will be done we will decide at that time.  He will be scheduled to have another echocardiogram in the year after November try to establish today speed of progression of this problem.  I told him that and at this stage he can do whatever he wants to do.  I encouraged him to stay in the good shape and exercise on the regular basis.  I explained to him what the typical signs and symptoms of the trouble.  And asking obviously to let me know if this develops.  But, at this stage of his aortic stenosis I do  not anticipate him to have any issues. 2. Dyslipidemia.  He is very active and does not have too many risk factors I will continue present management with good diet and exercises. 3. Dyspnea on exertion he is doing quite well running 5 miles 5 times a week.   Medication Adjustments/Labs and Tests Ordered: Current medicines are reviewed at length with the patient today.  Concerns regarding medicines are outlined above.  No orders of the defined types were placed in this encounter.  Medication changes: No orders of the defined types were placed in this encounter.   Signed, Park Liter, MD, [facility] 07/19/2019 8:52 AM    [place]

## 2019-07-19 NOTE — Patient Instructions (Signed)
Medication Instructions:  Your physician recommends that you continue on your current medications as directed. Please refer to the Current Medication list given to you today.  *If you need a refill on your cardiac medications before your next appointment, please call your pharmacy*  Lab Work: None.  If you have labs (blood work) drawn today and your tests are completely normal, you will receive your results only by: . MyChart Message (if you have MyChart) OR . A paper copy in the mail If you have any lab test that is abnormal or we need to change your treatment, we will call you to review the results.  Testing/Procedures: None.   Follow-Up: At [facility], you and your health needs are our priority.  As part of our continuing mission to provide you with exceptional heart care, we have created designated Provider Care Teams.  These Care Teams include your primary Cardiologist (physician) and Advanced Practice Providers (APPs -  Physician Assistants and Nurse Practitioners) who all work together to provide you with the care you need, when you need it.  Your next appointment:   6 month(s)  The format for your next appointment:   In Person  Provider:   You may see Dr. Krasowski or the following Advanced Practice Provider on your designated Care Team:    Caitlin Walker, FNP   Other Instructions   

## 2019-12-29 ENCOUNTER — Telehealth: Payer: Self-pay | Admitting: Cardiology

## 2019-12-29 NOTE — Telephone Encounter (Signed)
     I went in pt's chart to see when pt need his next office visit

## 2020-03-19 DIAGNOSIS — E785 Hyperlipidemia, unspecified: Secondary | ICD-10-CM | POA: Insufficient documentation

## 2020-03-19 DIAGNOSIS — Z8042 Family history of malignant neoplasm of prostate: Secondary | ICD-10-CM | POA: Insufficient documentation

## 2020-03-19 DIAGNOSIS — J479 Bronchiectasis, uncomplicated: Secondary | ICD-10-CM | POA: Insufficient documentation

## 2020-03-19 DIAGNOSIS — R972 Elevated prostate specific antigen [PSA]: Secondary | ICD-10-CM | POA: Insufficient documentation

## 2020-03-19 DIAGNOSIS — N529 Male erectile dysfunction, unspecified: Secondary | ICD-10-CM | POA: Insufficient documentation

## 2020-03-20 ENCOUNTER — Encounter: Payer: Self-pay | Admitting: Cardiology

## 2020-03-20 ENCOUNTER — Other Ambulatory Visit: Payer: Self-pay

## 2020-03-20 ENCOUNTER — Ambulatory Visit (INDEPENDENT_AMBULATORY_CARE_PROVIDER_SITE_OTHER): Payer: Medicare Other | Admitting: Cardiology

## 2020-03-20 VITALS — BP 112/60 | HR 88 | Ht 68.0 in | Wt 126.0 lb

## 2020-03-20 DIAGNOSIS — E782 Mixed hyperlipidemia: Secondary | ICD-10-CM

## 2020-03-20 DIAGNOSIS — J479 Bronchiectasis, uncomplicated: Secondary | ICD-10-CM | POA: Diagnosis not present

## 2020-03-20 DIAGNOSIS — I350 Nonrheumatic aortic (valve) stenosis: Secondary | ICD-10-CM | POA: Diagnosis not present

## 2020-03-20 NOTE — Progress Notes (Signed)
Cardiology Office Note:    Date:  03/20/2020   ID:  Henry Roth, DOB 01/29/1951, MRN [id number]  PCP:  Henry Gosselin, MD  Cardiologist:  Henry Balsam, MD    Referring MD: Henry Gosselin, MD   No chief complaint on file. I am doing fine  History of Present Illness:    Henry Roth is a 69 y.o. male with past medical history significant for aortic stenosis last assessed 1 year ago showing mild to moderate stenosis. He also does have hyperlipidemia with LDL of 133 last year. He comes today 2 months for follow-up. Overall he is doing very well still continue running on the regular basis about 5 times a week for about 3 miles. He also practice yoga about 1-1/2-hour every day. Denies have any chest pain tightness squeezing pressure burning chest, there is no worsening of his condition in terms of ability to exercise no dizziness no passing out. His daughter is with him in the office today.  Past Medical History:  Diagnosis Date  . Aortic stenosis mild to moderate in November 2020 07/19/2019  . Bronchiectasis (HCC)    of left lower lobe not requiring surgery  . Dyspnea on exertion 03/29/2019  . ED (erectile dysfunction)   . Elevated PSA    in march of 2010, treated with Cipro, Dr. Brunilda Roth  . Family history of prostate cancer    PTS FATHER  . History of echocardiogram 09/28/12   normal LVF, mild AVSC, mild AI/AS, trivial TR/MR and grade I diastolic dysfunction  . Hyperlipidemia   . Obstructive bronchiectasis (HCC) 02/06/2015   Followed in Pulmonary clinic/ [facility]/ [facility] - 02/20/2015 trial of dulera 100 2bid - HRCT chest  03/06/15  1. Patchy bilateral peribronchovascular nodularity and ground-glass with focal volume loss and bronchiectasis in the right lower lobe. Debris in lower lobe bronchi bilaterally.  - Sinus CT  ? Acute on chronic dz > Augmentin 875 mg take one pill twice daily  X 20 days then repeat > de  . Pure hypercholesterolemia 03/29/2019  . Sinusitis, chronic  03/06/2015   See sinus CT 03/13/15 > rx augmentin x 20 days > symptoms resolved/ insurance denied f/u scan   . Undiagnosed cardiac murmurs 10/12/2013    Past Surgical History:  Procedure Laterality Date  . NO PAST SURGERIES      Current Medications: No outpatient medications have been marked as taking for the 03/20/20 encounter (Office Visit) with Henry Lea, MD.     Allergies:   Fish oil   Social History   Socioeconomic History  . Marital status: Single    Spouse name: Not on file  . Number of children: Not on file  . Years of education: Not on file  . Highest education level: Not on file  Occupational History  . Not on file  Tobacco Use  . Smoking status: Never Smoker  . Smokeless tobacco: Never Used  Substance and Sexual Activity  . Alcohol use: No    Alcohol/week: 0.0 standard drinks  . Drug use: No  . Sexual activity: Not on file  Other Topics Concern  . Not on file  Social History Narrative  . Not on file   Social Determinants of Health   Financial Resource Strain:   . Difficulty of Paying Living Expenses: Not on file  Food Insecurity:   . Worried About Programme researcher, broadcasting/film/video in the Last Year: Not on file  . Ran Out of Food in the Last Year: Not  on file  Transportation Needs:   . Lack of Transportation (Medical): Not on file  . Lack of Transportation (Non-Medical): Not on file  Physical Activity:   . Days of Exercise per Week: Not on file  . Minutes of Exercise per Session: Not on file  Stress:   . Feeling of Stress : Not on file  Social Connections:   . Frequency of Communication with Friends and Family: Not on file  . Frequency of Social Gatherings with Friends and Family: Not on file  . Attends Religious Services: Not on file  . Active Member of Clubs or Organizations: Not on file  . Attends Banker Meetings: Not on file  . Marital Status: Not on file     Family History: The patient's family history includes Liver cancer in  his father. ROS:   Please see the history of present illness.    All 14 point review of systems negative except as described per history of present illness  EKGs/Labs/Other Studies Reviewed:      Recent Labs: No results found for requested labs within last 8760 hours.  Recent Lipid Panel No results found for: CHOL, TRIG, HDL, CHOLHDL, VLDL, LDLCALC, LDLDIRECT  Physical Exam:    VS:  BP 112/60   Pulse 88   Ht 5\' 8"  (1.727 m)   Wt 126 lb (57.2 kg)   SpO2 97%   BMI 19.16 kg/m     Wt Readings from Last 3 Encounters:  03/20/20 126 lb (57.2 kg)  07/19/19 126 lb 12.8 oz (57.5 kg)  10/02/15 122 lb (55.3 kg)     GEN:  Well nourished, well developed in no acute distress HEENT: Normal NECK: No JVD; No carotid bruits LYMPHATICS: No lymphadenopathy CARDIAC: RRR, early peaking systolic ejection murmur best heard right upper portion of the sternum, S2 is still present, no rubs, no gallops RESPIRATORY:  Clear to auscultation without rales, wheezing or rhonchi  ABDOMEN: Soft, non-tender, non-distended MUSCULOSKELETAL:  No edema; No deformity  SKIN: Warm and dry LOWER EXTREMITIES: no swelling NEUROLOGIC:  Alert and oriented x 3 PSYCHIATRIC:  Normal affect   ASSESSMENT:    1. Nonrheumatic aortic valve stenosis   2. Mixed hyperlipidemia   3. Obstructive bronchiectasis (HCC)    PLAN:    In order of problems listed above:  1. Nonrheumatic aortic valve stenosis down to repeat his echocardiogram which we will make arrangements for. I explained to him signs and symptoms of significant artery stenosis he also mentioned that I doubt very much that he is having critical aortic stenosis right now. 2. Mixed dyslipidemia I did review his K PN which show LDL of 133, HDL 47 this is from October 2020 which is year ago. Patient dose of beta we did calculated 10 years risk for coronary events which was 12.2 it is considered intermediate and deserves to be treated with medications, however he is  reluctant, therefore, will wait for results of repeated fasting lipid profile which he will have done within next week. 3. Obstructive bronchiectasis-followed by internal medicine team. 4. We did talk about basic of Mediterranean diet. And I complimented him for being proactive doing yoga as well as running every day 5 times a week.   Medication Adjustments/Labs and Tests Ordered: Current medicines are reviewed at length with the patient today.  Concerns regarding medicines are outlined above.  No orders of the defined types were placed in this encounter.  Medication changes: No orders of the defined types were placed in  this encounter.   Signed, Henry Lea, MD, [facility] 03/20/2020 1:53 PM    [facility]

## 2020-03-20 NOTE — Patient Instructions (Signed)
Medication Instructions:  Your physician recommends that you continue on your current medications as directed. Please refer to the Current Medication list given to you today.  *If you need a refill on your cardiac medications before your next appointment, please call your pharmacy*   Lab Work: none If you have labs (blood work) drawn today and your tests are completely normal, you will receive your results only by: Marland Kitchen MyChart Message (if you have MyChart) OR . A paper copy in the mail If you have any lab test that is abnormal or we need to change your treatment, we will call you to review the results.   Testing/Procedures: Your physician has requested that you have an echocardiogram. Echocardiography is a painless test that uses sound waves to create images of your heart. It provides your doctor with information about the size and shape of your heart and how well your heart's chambers and valves are working. This procedure takes approximately one hour. There are no restrictions for this procedure.    Follow-Up: At [facility], you and your health needs are our priority.  As part of our continuing mission to provide you with exceptional heart care, we have created designated Provider Care Teams.  These Care Teams include your primary Cardiologist (physician) and Advanced Practice Providers (APPs -  Physician Assistants and Nurse Practitioners) who all work together to provide you with the care you need, when you need it.  We recommend signing up for the patient portal called "MyChart".  Sign up information is provided on this After Visit Summary.  MyChart is used to connect with patients for Virtual Visits (Telemedicine).  Patients are able to view lab/test results, encounter notes, upcoming appointments, etc.  Non-urgent messages can be sent to your provider as well.   To learn more about what you can do with MyChart, go to [email or web address].    Your next appointment:   1  year(s)  The format for your next appointment:   In Person  Provider:   Gypsy Balsam, MD   Other Instructions   Echocardiogram An echocardiogram is a procedure that uses painless sound waves (ultrasound) to produce an image of the heart. Images from an echocardiogram can provide important information about:  Signs of coronary artery disease (CAD).  Aneurysm detection. An aneurysm is a weak or damaged part of an artery wall that bulges out from the normal force of blood pumping through the body.  Heart size and shape. Changes in the size or shape of the heart can be associated with certain conditions, including heart failure, aneurysm, and CAD.  Heart muscle function.  Heart valve function.  Signs of a past heart attack.  Fluid buildup around the heart.  Thickening of the heart muscle.  A tumor or infectious growth around the heart valves. Tell a health care provider about:  Any allergies you have.  All medicines you are taking, including vitamins, herbs, eye drops, creams, and over-the-counter medicines.  Any blood disorders you have.  Any surgeries you have had.  Any medical conditions you have.  Whether you are pregnant or may be pregnant. What are the risks? Generally, this is a safe procedure. However, problems may occur, including:  Allergic reaction to dye (contrast) that may be used during the procedure. What happens before the procedure? No specific preparation is needed. You may eat and drink normally. What happens during the procedure?   An IV tube may be inserted into one of your veins.  You may receive contrast  through this tube. A contrast is an injection that improves the quality of the pictures from your heart.  A gel will be applied to your chest.  A wand-like tool (transducer) will be moved over your chest. The gel will help to transmit the sound waves from the transducer.  The sound waves will harmlessly bounce off of your heart to  allow the heart images to be captured in real-time motion. The images will be recorded on a computer. The procedure may vary among health care providers and hospitals. What happens after the procedure?  You may return to your normal, everyday life, including diet, activities, and medicines, unless your health care provider tells you not to do that. Summary  An echocardiogram is a procedure that uses painless sound waves (ultrasound) to produce an image of the heart.  Images from an echocardiogram can provide important information about the size and shape of your heart, heart muscle function, heart valve function, and fluid buildup around your heart.  You do not need to do anything to prepare before this procedure. You may eat and drink normally.  After the echocardiogram is completed, you may return to your normal, everyday life, unless your health care provider tells you not to do that. This information is not intended to replace advice given to you by your health care provider. Make sure you discuss any questions you have with your health care provider. Document Revised: 09/02/2018 Document Reviewed: 06/14/2016 Elsevier Patient Education  [place].

## 2020-04-03 ENCOUNTER — Telehealth: Payer: Self-pay | Admitting: Cardiology

## 2020-04-03 ENCOUNTER — Ambulatory Visit (HOSPITAL_COMMUNITY)
Admission: RE | Admit: 2020-04-03 | Discharge: 2020-04-03 | Disposition: A | Discharge status: Home / Self Care | Payer: Medicare Other | Source: Ambulatory Visit | Attending: Cardiology | Admitting: Cardiology

## 2020-04-03 ENCOUNTER — Other Ambulatory Visit: Payer: Self-pay

## 2020-04-03 DIAGNOSIS — I350 Nonrheumatic aortic (valve) stenosis: Secondary | ICD-10-CM | POA: Diagnosis not present

## 2020-04-03 DIAGNOSIS — E785 Hyperlipidemia, unspecified: Secondary | ICD-10-CM | POA: Insufficient documentation

## 2020-04-03 DIAGNOSIS — I352 Nonrheumatic aortic (valve) stenosis with insufficiency: Secondary | ICD-10-CM | POA: Insufficient documentation

## 2020-04-03 DIAGNOSIS — I351 Nonrheumatic aortic (valve) insufficiency: Secondary | ICD-10-CM

## 2020-04-03 LAB — ECHOCARDIOGRAM COMPLETE
AR max vel: 1.01 cm2
AV Area VTI: 0.86 cm2
AV Area mean vel: 0.96 cm2
AV Mean grad: 13 mmHg
AV Peak grad: 23.6 mmHg
Ao pk vel: 2.43 m/s
Area-P 1/2: 3.65 cm2
S' Lateral: 2.5 cm

## 2020-04-03 NOTE — Telephone Encounter (Signed)
Pt returning Hayley's call in regards to echo results.

## 2020-04-03 NOTE — Telephone Encounter (Signed)
Called patient informed him of results.  

## 2020-04-03 NOTE — Progress Notes (Signed)
  Echocardiogram 2D Echocardiogram has been performed.  Leta Jungling M 04/03/2020, 11:55 AM

## 2021-06-25 ENCOUNTER — Other Ambulatory Visit: Payer: Self-pay

## 2021-06-25 ENCOUNTER — Ambulatory Visit (INDEPENDENT_AMBULATORY_CARE_PROVIDER_SITE_OTHER): Payer: 59 | Admitting: Cardiology

## 2021-06-25 ENCOUNTER — Encounter: Payer: Self-pay | Admitting: Cardiology

## 2021-06-25 VITALS — BP 120/68 | HR 76 | Ht 70.0 in | Wt 123.1 lb

## 2021-06-25 DIAGNOSIS — J479 Bronchiectasis, uncomplicated: Secondary | ICD-10-CM | POA: Diagnosis not present

## 2021-06-25 DIAGNOSIS — E7800 Pure hypercholesterolemia, unspecified: Secondary | ICD-10-CM

## 2021-06-25 DIAGNOSIS — I350 Nonrheumatic aortic (valve) stenosis: Secondary | ICD-10-CM | POA: Diagnosis not present

## 2021-06-25 MED ORDER — SILDENAFIL CITRATE 25 MG PO TABS: 25.0000 mg | ORAL_TABLET | Freq: Every day | ORAL | 5 refills | Status: DC | PRN

## 2021-06-25 NOTE — Progress Notes (Signed)
Cardiology Office Note:    Date:  06/25/2021   ID:  Henry Roth, DOB 12-20-1950, MRN [id number]  PCP:  No primary care provider on file.  Cardiologist:  Jenne Campus, MD    Referring MD: Hulan Fess, MD   No chief complaint on file. Doing well  History of Present Illness:    Henry Roth is a 71 y.o. male with past medical history significant for aortic stenosis last assessment 1-1/2-year ago which was mild to moderate, dyslipidemia with last assessment year ago with LDL of 133, he also got some bronchiectasis, also today for the first time he mentioned to me that he does have a problem with erection.  He does have erectile dysfunction.  Denies have any chest pain tightness squeezing pressure burning chest.  He exercised on the regular basis with no difficulties.  He runs every day 3 to 5 miles and then to practice yoga few times a week.  No difficulty doing it  Past Medical History:  Diagnosis Date   Aortic stenosis mild to moderate in November 2020 07/19/2019   Bronchiectasis [facility])    of left lower lobe not requiring surgery   Dyspnea on exertion 03/29/2019   ED (erectile dysfunction)    Elevated PSA    in march of 2010, treated with Cipro, Dr. Janice Norrie   Family history of prostate cancer    PTS FATHER   History of echocardiogram 09/28/12   normal LVF, mild AVSC, mild AI/AS, trivial TR/MR and grade I diastolic dysfunction   Hyperlipidemia    Obstructive bronchiectasis ([place]) 02/06/2015   Followed in Pulmonary clinic/ [facility]/ [place] - 02/20/2015 trial of dulera 100 2bid - HRCT chest  03/06/15  1. Patchy bilateral peribronchovascular nodularity and ground-glass with focal volume loss and bronchiectasis in the right lower lobe. Debris in lower lobe bronchi bilaterally.  - Sinus CT  ? Acute on chronic dz > Augmentin 875 mg take one pill twice daily  X 20 days then repeat > de   Pure hypercholesterolemia 03/29/2019   Sinusitis, chronic 03/06/2015   See sinus CT 03/13/15  > rx augmentin x 20 days > symptoms resolved/ insurance denied f/u scan    Undiagnosed cardiac murmurs 10/12/2013    Past Surgical History:  Procedure Laterality Date   NO PAST SURGERIES      Current Medications: No outpatient medications have been marked as taking for the 06/25/21 encounter (Office Visit) with Park Liter, MD.     Allergies:   Fish oil   Social History   Socioeconomic History   Marital status: Single    Spouse name: Not on file   Number of children: Not on file   Years of education: Not on file   Highest education level: Not on file  Occupational History   Not on file  Tobacco Use   Smoking status: Never    Passive exposure: Never   Smokeless tobacco: Never  Vaping Use   Vaping Use: Never used  Substance and Sexual Activity   Alcohol use: No    Alcohol/week: 0.0 standard drinks   Drug use: No   Sexual activity: Not on file  Other Topics Concern   Not on file  Social History Narrative   Not on file   Social Determinants of Health   Financial Resource Strain: Not on file  Food Insecurity: Not on file  Transportation Needs: Not on file  Physical Activity: Not on file  Stress: Not on file  Social Connections: Not  on file     Family History: The patient's family history includes Liver cancer in his father. ROS:   Please see the history of present illness.    All 14 point review of systems negative except as described per history of present illness  EKGs/Labs/Other Studies Reviewed:      Recent Labs: No results found for requested labs within last 8760 hours.  Recent Lipid Panel No results found for: CHOL, TRIG, HDL, CHOLHDL, VLDL, LDLCALC, LDLDIRECT  Physical Exam:    VS:  BP 120/68 (BP Location: Left Arm)    Pulse 76    Ht 5\' 10"  (1.778 m)    Wt 123 lb 1.9 oz (55.8 kg)    SpO2 97%    BMI 17.67 kg/m     Wt Readings from Last 3 Encounters:  06/25/21 123 lb 1.9 oz (55.8 kg)  03/20/20 126 lb (57.2 kg)  07/19/19 126 lb 12.8 oz  (57.5 kg)     GEN:  Well nourished, well developed in no acute distress HEENT: Normal NECK: No JVD; No carotid bruits LYMPHATICS: No lymphadenopathy CARDIAC: RRR, systolic ejection murmur grade 2/6 to 3/6 best heard right upper portion of the sternum, S2 is still present, no rubs, no gallops RESPIRATORY:  Clear to auscultation without rales, wheezing or rhonchi  ABDOMEN: Soft, non-tender, non-distended MUSCULOSKELETAL:  No edema; No deformity  SKIN: Warm and dry LOWER EXTREMITIES: no swelling NEUROLOGIC:  Alert and oriented x 3 PSYCHIATRIC:  Normal affect   ASSESSMENT:    1. Nonrheumatic aortic valve stenosis   2. Obstructive bronchiectasis (HCC)   3. Pure hypercholesterolemia    PLAN:    In order of problems listed above:  Nonrheumatic arctic valve stenosis echocardiogram will be done to reassess the lesion. Obstructive bronchiectasis followed by internal medicine team. Dyslipidemia he try red yeast rice does not want to take any medication for it likely his cholesterol overall improved his LDL went down to 118.  He does not want any medications. Erectile dysfunction which is a new diagnosis.  I sent him prescription for Viagra asking not to take it until we repeat his echocardiogram.  If echocardiogram is fine should be fine for him to use that medication.   Medication Adjustments/Labs and Tests Ordered: Current medicines are reviewed at length with the patient today.  Concerns regarding medicines are outlined above.  No orders of the defined types were placed in this encounter.  Medication changes: No orders of the defined types were placed in this encounter.   Signed, Park Liter, MD, [facility] 06/25/2021 2:43 PM    [place]

## 2021-06-25 NOTE — Patient Instructions (Signed)
Medication Instructions:  Your physician has recommended you make the following change in your medication:   START: Viagra 25 mg daily PRN   *If you need a refill on your cardiac medications before your next appointment, please call your pharmacy*   Lab Work: None If you have labs (blood work) drawn today and your tests are completely normal, you will receive your results only by: MyChart Message (if you have MyChart) OR A paper copy in the mail If you have any lab test that is abnormal or we need to change your treatment, we will call you to review the results.   Testing/Procedures: Your physician has requested that you have an echocardiogram. Echocardiography is a painless test that uses sound waves to create images of your heart. It provides your doctor with information about the size and shape of your heart and how well your hearts chambers and valves are working. This procedure takes approximately one hour. There are no restrictions for this procedure.    Follow-Up: At [facility], you and your health needs are our priority.  As part of our continuing mission to provide you with exceptional heart care, we have created designated Provider Care Teams.  These Care Teams include your primary Cardiologist (physician) and Advanced Practice Providers (APPs -  Physician Assistants and Nurse Practitioners) who all work together to provide you with the care you need, when you need it.  We recommend signing up for the patient portal called "MyChart".  Sign up information is provided on this After Visit Summary.  MyChart is used to connect with patients for Virtual Visits (Telemedicine).  Patients are able to view lab/test results, encounter notes, upcoming appointments, etc.  Non-urgent messages can be sent to your provider as well.   To learn more about what you can do with MyChart, go to [email or web address].    Your next appointment:   1 year(s)  The format for your next  appointment:   In Person  Provider:   Gypsy Balsam, MD    Other Instructions None

## 2021-07-02 ENCOUNTER — Other Ambulatory Visit: Payer: Self-pay

## 2021-07-02 ENCOUNTER — Ambulatory Visit (HOSPITAL_COMMUNITY)
Admission: RE | Admit: 2021-07-02 | Discharge: 2021-07-02 | Disposition: A | Discharge status: Home / Self Care | Payer: 59 | Source: Ambulatory Visit | Attending: Cardiology | Admitting: Cardiology

## 2021-07-02 ENCOUNTER — Other Ambulatory Visit (HOSPITAL_COMMUNITY): Payer: Medicare Other

## 2021-07-02 DIAGNOSIS — I350 Nonrheumatic aortic (valve) stenosis: Secondary | ICD-10-CM

## 2021-07-02 DIAGNOSIS — E7800 Pure hypercholesterolemia, unspecified: Secondary | ICD-10-CM | POA: Diagnosis not present

## 2021-07-02 DIAGNOSIS — J479 Bronchiectasis, uncomplicated: Secondary | ICD-10-CM | POA: Insufficient documentation

## 2021-07-02 DIAGNOSIS — I352 Nonrheumatic aortic (valve) stenosis with insufficiency: Secondary | ICD-10-CM | POA: Diagnosis not present

## 2021-07-02 DIAGNOSIS — I700 Atherosclerosis of aorta: Secondary | ICD-10-CM | POA: Diagnosis not present

## 2021-07-02 LAB — ECHOCARDIOGRAM COMPLETE
AR max vel: 1.23 cm2
AV Area VTI: 1.25 cm2
AV Area mean vel: 1.14 cm2
AV Mean grad: 13.5 mmHg
AV Peak grad: 25.5 mmHg
Ao pk vel: 2.53 m/s
Area-P 1/2: 3.72 cm2
P 1/2 time: 598 msec
S' Lateral: 3.3 cm

## 2021-07-03 ENCOUNTER — Telehealth: Payer: Self-pay

## 2021-07-03 NOTE — Telephone Encounter (Signed)
Patient notified of results and asked if he can continue with Viagra. Message sent to Dr. Kirtland Bouchard

## 2021-07-03 NOTE — Telephone Encounter (Signed)
-----   Message from [name], MD sent at 07/03/2021 11:17 AM EST ----- Echocardiogram showed normal left ventricle ejection fraction, unchanged aortic valve stenosis and regurgitation

## 2021-07-05 ENCOUNTER — Telehealth: Payer: Self-pay | Admitting: Cardiology

## 2021-07-05 ENCOUNTER — Telehealth: Payer: Self-pay

## 2021-07-05 NOTE — Telephone Encounter (Signed)
Patient notified

## 2021-07-05 NOTE — Telephone Encounter (Signed)
-----   Message from Park Liter, MD sent at 07/05/2021  1:19 PM EST ----- Yes, he can keep taking Viagra ----- Message ----- From: Darrel Reach, CMA Sent: 07/03/2021   4:31 PM EST To: Park Liter, MD  Patient notified of results and asked can he continue taking Viagra? ----- Message ----- From: Park Liter, MD Sent: 07/03/2021  11:17 AM EST To: Louie Casa, RN  Echocardiogram showed normal left ventricle ejection fraction, unchanged aortic valve stenosis and regurgitation

## 2021-07-05 NOTE — Telephone Encounter (Signed)
Pt c/o medication issue:  1. Name of Medication: sildenafil (VIAGRA) 25 MG tablet  2. How are you currently taking this medication (dosage and times per day)? Take 1 tablet (25 mg total) by mouth daily as needed for erectile dysfunction  3. Are you having a reaction (difficulty breathing--STAT)? no  4. What is your medication issue? Patient states that the medication is too expensive. Calling to see if there another way he can get medication. Please advise

## 2021-07-08 NOTE — Telephone Encounter (Signed)
Patient notified of the following and he will try using a good Rx card for a discount.

## 2022-08-05 ENCOUNTER — Encounter: Payer: Self-pay | Admitting: Cardiology

## 2022-08-05 ENCOUNTER — Ambulatory Visit: Payer: No Typology Code available for payment source | Attending: Cardiology | Admitting: Cardiology

## 2022-08-05 VITALS — BP 102/62 | HR 73 | Ht 70.0 in | Wt 131.0 lb

## 2022-08-05 DIAGNOSIS — I350 Nonrheumatic aortic (valve) stenosis: Secondary | ICD-10-CM | POA: Diagnosis not present

## 2022-08-05 DIAGNOSIS — E782 Mixed hyperlipidemia: Secondary | ICD-10-CM | POA: Insufficient documentation

## 2022-08-05 DIAGNOSIS — N529 Male erectile dysfunction, unspecified: Secondary | ICD-10-CM | POA: Insufficient documentation

## 2022-08-05 NOTE — Progress Notes (Signed)
Cardiology Office Note:    Date:  08/05/2022   ID:  Henry Roth, DOB 1951-04-04, MRN [id number]  PCP:  Pcp, No  Cardiologist:  Jenne Campus, MD    Referring MD: No ref. provider found   No chief complaint on file.   History of Present Illness:    Henry Roth is a 72 y.o. male  Medical history significant for moderate aortic stenosis last assessment year ago, dyslipidemia.  Comes today to months for follow-up we will doing very well still continue to rounds every day 3 to 5 months with no difficulties, he is also practicing yoga.  He did not notice any decrease in ability to exercise  Past Medical History:  Diagnosis Date   Aortic stenosis mild to moderate in November 2020 07/19/2019   Bronchiectasis [facility])    of left lower lobe not requiring surgery   Dyspnea on exertion 03/29/2019   ED (erectile dysfunction)    Elevated PSA    in march of 2010, treated with Cipro, Dr. Janice Norrie   Family history of prostate cancer    PTS FATHER   History of echocardiogram 09/28/12   normal LVF, mild AVSC, mild AI/AS, trivial TR/MR and grade I diastolic dysfunction   Hyperlipidemia    Obstructive bronchiectasis ([place]) 02/06/2015   Followed in Pulmonary clinic/ [facility]/ [place] - 02/20/2015 trial of dulera 100 2bid - HRCT chest  03/06/15  1. Patchy bilateral peribronchovascular nodularity and ground-glass with focal volume loss and bronchiectasis in the right lower lobe. Debris in lower lobe bronchi bilaterally.  - Sinus CT  ? Acute on chronic dz > Augmentin 875 mg take one pill twice daily  X 20 days then repeat > de   Pure hypercholesterolemia 03/29/2019   Sinusitis, chronic 03/06/2015   See sinus CT 03/13/15 > rx augmentin x 20 days > symptoms resolved/ insurance denied f/u scan    Undiagnosed cardiac murmurs 10/12/2013    Past Surgical History:  Procedure Laterality Date   NO PAST SURGERIES      Current Medications: Current Meds  Medication Sig   Red Yeast Rice Extract (RED  YEAST RICE PO) Take 1 tablet by mouth in the morning and at bedtime.   sildenafil (VIAGRA) 25 MG tablet Take 1 tablet (25 mg total) by mouth daily as needed for erectile dysfunction.     Allergies:   Fish oil   Social History   Socioeconomic History   Marital status: Single    Spouse name: Not on file   Number of children: Not on file   Years of education: Not on file   Highest education level: Not on file  Occupational History   Not on file  Tobacco Use   Smoking status: Never    Passive exposure: Never   Smokeless tobacco: Never  Vaping Use   Vaping Use: Never used  Substance and Sexual Activity   Alcohol use: No    Alcohol/week: 0.0 standard drinks of alcohol   Drug use: No   Sexual activity: Not on file  Other Topics Concern   Not on file  Social History Narrative   Not on file   Social Determinants of Health   Financial Resource Strain: Not on file  Food Insecurity: Not on file  Transportation Needs: Not on file  Physical Activity: Not on file  Stress: Not on file  Social Connections: Not on file     Family History: The patient's family history includes Liver cancer in his father. ROS:  Please see the history of present illness.    All 14 point review of systems negative except as described per history of present illness  EKGs/Labs/Other Studies Reviewed:      Recent Labs: No results found for requested labs within last 365 days.  Recent Lipid Panel No results found for: "CHOL", "TRIG", "HDL", "CHOLHDL", "VLDL", "LDLCALC", "LDLDIRECT"  Physical Exam:    VS:  BP 102/62 (BP Location: Left Arm, Patient Position: Sitting, Cuff Size: Normal)   Pulse 73   Ht '5\' 10"'$  (1.778 m)   Wt 131 lb (59.4 kg)   SpO2 97%   BMI 18.80 kg/m     Wt Readings from Last 3 Encounters:  08/05/22 131 lb (59.4 kg)  06/25/21 123 lb 1.9 oz (55.8 kg)  03/20/20 126 lb (57.2 kg)     GEN:  Well nourished, well developed in no acute distress HEENT: Normal NECK: No JVD; No  carotid bruits LYMPHATICS: No lymphadenopathy CARDIAC: RRR, systolic ejection murmur grade 2/6 best heard right upper portion of the sternum mid peaking, S2 is still present, no rubs, no gallops RESPIRATORY:  Clear to auscultation without rales, wheezing or rhonchi  ABDOMEN: Soft, non-tender, non-distended MUSCULOSKELETAL:  No edema; No deformity  SKIN: Warm and dry LOWER EXTREMITIES: no swelling NEUROLOGIC:  Alert and oriented x 3 PSYCHIATRIC:  Normal affect   ASSESSMENT:    1. Nonrheumatic aortic valve stenosis   2. Mixed hyperlipidemia   3. Erectile dysfunction, unspecified erectile dysfunction type    PLAN:    In order of problems listed above:  Nonrheumatic aortic valve mostly sinus moderate on the physical exam last echocardiogram confirmed that.  Will schedule him to have another echocardiogram.  I discussed again with him 3 signs and symptoms of worsening of her arctic stenosis which is shortness of breath chest pain dizziness passing out.  He promised to let me know if he develop those. Dyslipidemia I did review his K PN which show LDL of 168, HDL 50.  Clearly not well-controlled I offer him medication he tried to do better in terms of exercises and eating.  Will give him 3 months we will schedule him to have fasting lipid profile done in 3 months. Erectile dysfunction doing well   Medication Adjustments/Labs and Tests Ordered: Current medicines are reviewed at length with the patient today.  Concerns regarding medicines are outlined above.  No orders of the defined types were placed in this encounter.  Medication changes: No orders of the defined types were placed in this encounter.   Signed, Park Liter, MD, [facility] 08/05/2022 3:23 PM    [place]

## 2022-08-05 NOTE — Patient Instructions (Signed)
Medication Instructions:  Your physician recommends that you continue on your current medications as directed. Please refer to the Current Medication list given to you today.  *If you need a refill on your cardiac medications before your next appointment, please call your pharmacy*   Lab Work: [place] recommends that you return for lab work in:  3 months You need to have labs done when you are fasting.  You can come Monday through Friday 8:00 am to 11:30am and 1:00 to 4:00. You do not need to make an appointment as the order has already been placed.   Testing/Procedures: Your physician has requested that you have an echocardiogram. Echocardiography is a painless test that uses sound waves to create images of your heart. It provides your doctor with information about the size and shape of your heart and how well your heart's chambers and valves are working. This procedure takes approximately one hour. There are no restrictions for this procedure. Please do NOT wear cologne, perfume, aftershave, or lotions (deodorant is allowed). Please arrive 15 minutes prior to your appointment time.    Follow-Up: At [facility], you and your health needs are our priority.  As part of our continuing mission to provide you with exceptional heart care, we have created designated Provider Care Teams.  These Care Teams include your primary Cardiologist (physician) and Advanced Practice Providers (APPs -  Physician Assistants and Nurse Practitioners) who all work together to provide you with the care you need, when you need it.  We recommend signing up for the patient portal called "MyChart".  Sign up information is provided on this After Visit Summary.  MyChart is used to connect with patients for Virtual Visits (Telemedicine).  Patients are able to view lab/test results, encounter notes, upcoming appointments, etc.  Non-urgent messages can be sent to your provider as well.   To learn  more about what you can do with MyChart, go to [email or web address].    Your next appointment:   12 month(s)  The format for your next appointment:   In Person  Provider:   Jenne Campus, MD    Other Instructions NA

## 2022-08-05 NOTE — Addendum Note (Signed)
Addended by: Jacobo Forest D on: 08/05/2022 03:28 PM   Modules accepted: Orders

## 2022-09-02 ENCOUNTER — Ambulatory Visit (HOSPITAL_COMMUNITY): Payer: No Typology Code available for payment source | Attending: Cardiology

## 2022-09-02 DIAGNOSIS — I350 Nonrheumatic aortic (valve) stenosis: Secondary | ICD-10-CM | POA: Insufficient documentation

## 2022-09-03 LAB — ECHOCARDIOGRAM COMPLETE
AR max vel: 1.23 cm2
AV Area VTI: 1.28 cm2
AV Area mean vel: 1.21 cm2
AV Mean grad: 16 mmHg
AV Peak grad: 27 mmHg
Ao pk vel: 2.6 m/s
Area-P 1/2: 5.27 cm2
P 1/2 time: 459 msec
S' Lateral: 2.7 cm

## 2022-09-11 ENCOUNTER — Telehealth: Payer: Self-pay

## 2022-09-11 NOTE — Telephone Encounter (Signed)
LVM per DPR- per Dr. Krasowski's note regarding normal Echo results. Encouraged to call with any questions. Routed to PCP. 

## 2023-09-29 ENCOUNTER — Ambulatory Visit: Admitting: Cardiology

## 2023-12-22 ENCOUNTER — Ambulatory Visit: Attending: Cardiology | Admitting: Cardiology

## 2023-12-22 ENCOUNTER — Encounter: Payer: Self-pay | Admitting: Cardiology

## 2023-12-22 VITALS — BP 118/70 | HR 77 | Ht 68.0 in | Wt 123.0 lb

## 2023-12-22 DIAGNOSIS — R0609 Other forms of dyspnea: Secondary | ICD-10-CM | POA: Diagnosis not present

## 2023-12-22 DIAGNOSIS — I350 Nonrheumatic aortic (valve) stenosis: Secondary | ICD-10-CM | POA: Insufficient documentation

## 2023-12-22 DIAGNOSIS — J479 Bronchiectasis, uncomplicated: Secondary | ICD-10-CM | POA: Diagnosis not present

## 2023-12-22 DIAGNOSIS — E782 Mixed hyperlipidemia: Secondary | ICD-10-CM

## 2023-12-22 NOTE — Patient Instructions (Signed)
 Medication Instructions:  Your physician recommends that you continue on your current medications as directed. Please refer to the Current Medication list given to you today.  *If you need a refill on your cardiac medications before your next appointment, please call your pharmacy*   Lab Work: None Ordered If you have labs (blood work) drawn today and your tests are completely normal, you will receive your results only by: MyChart Message (if you have MyChart) OR A paper copy in the mail If you have any lab test that is abnormal or we need to change your treatment, we will call you to review the results.   Testing/Procedures: Your physician has requested that you have an echocardiogram. Echocardiography is a painless test that uses sound waves to create images of your heart. It provides your doctor with information about the size and shape of your heart and how well your heart's chambers and valves are working. This procedure takes approximately one hour. There are no restrictions for this procedure. Please do NOT wear cologne, perfume, aftershave, or lotions (deodorant is allowed). Please arrive 15 minutes prior to your appointment time.  Please note: We ask at that you not bring children with you during ultrasound (echo/ vascular) testing. Due to room size and safety concerns, children are not allowed in the ultrasound rooms during exams. Our front office staff cannot provide observation of children in our lobby area while testing is being conducted. An adult accompanying a patient to their appointment will only be allowed in the ultrasound room at the discretion of the ultrasound technician under special circumstances. We apologize for any inconvenience.    Follow-Up: At [facility], you and your health needs are our priority.  As part of our continuing mission to provide you with exceptional heart care, we have created designated Provider Care Teams.  These Care Teams include your  primary Cardiologist (physician) and Advanced Practice Providers (APPs -  Physician Assistants and Nurse Practitioners) who all work together to provide you with the care you need, when you need it.  We recommend signing up for the patient portal called "MyChart".  Sign up information is provided on this After Visit Summary.  MyChart is used to connect with patients for Virtual Visits (Telemedicine).  Patients are able to view lab/test results, encounter notes, upcoming appointments, etc.  Non-urgent messages can be sent to your provider as well.   To learn more about what you can do with MyChart, go to [email or web address].    Your next appointment:   12 month(s)  The format for your next appointment:   In Person  Provider:   Gypsy Balsam, MD    Other Instructions NA

## 2023-12-22 NOTE — Progress Notes (Unsigned)
 Cardiology Office Note:    Date:  12/22/2023   ID:  Henry Roth, DOB 18-Jul-1950, MRN [id number]  PCP:  Pcp, No  Cardiologist:  Lamar Fitch, MD    Referring MD: No ref. provider found   Chief Complaint  Patient presents with   Follow-up    History of Present Illness:    Henry Roth is a 73 y.o. male past medical history significant for moderate aortic stenosis, dyslipidemia.  Comes today 2 months for follow-up overall he is doing very well exercise on the regular basis.  Does not want to take any cholesterol medication he did talk to dietitian make a lot of changes in his diet hoping that that will decrease his cholesterol.  Exercise no chest pain no shortness of breath no dizziness no passing out  Past Medical History:  Diagnosis Date   Aortic stenosis mild to moderate in November 2020 07/19/2019   Bronchiectasis [facility])    of left lower lobe not requiring surgery   Dyspnea on exertion 03/29/2019   ED (erectile dysfunction)    Elevated PSA    in march of 2010, treated with Cipro, Dr. Aleene   Family history of prostate cancer    PTS FATHER   History of echocardiogram 09/28/12   normal LVF, mild AVSC, mild AI/AS, trivial TR/MR and grade I diastolic dysfunction   Hyperlipidemia    Obstructive bronchiectasis (HCC) 02/06/2015   Followed in Pulmonary clinic/ [facility]/ [place] - 02/20/2015 trial of dulera 100 2bid - HRCT chest  03/06/15  1. Patchy bilateral peribronchovascular nodularity and ground-glass with focal volume loss and bronchiectasis in the right lower lobe. Debris in lower lobe bronchi bilaterally.  - Sinus CT  ? Acute on chronic dz > Augmentin  875 mg take one pill twice daily  X 20 days then repeat > de   Pure hypercholesterolemia 03/29/2019   Sinusitis, chronic 03/06/2015   See sinus CT 03/13/15 > rx augmentin  x 20 days > symptoms resolved/ insurance denied f/u scan    Undiagnosed cardiac murmurs 10/12/2013    Past Surgical History:  Procedure Laterality  Date   NO PAST SURGERIES      Current Medications: Current Meds  Medication Sig   Red Yeast Rice Extract (RED YEAST RICE PO) Take 1 tablet by mouth in the morning and at bedtime.   [DISCONTINUED] sildenafil  (VIAGRA ) 25 MG tablet Take 1 tablet (25 mg total) by mouth daily as needed for erectile dysfunction.     Allergies:   Fish oil   Social History   Socioeconomic History   Marital status: Single    Spouse name: Not on file   Number of children: Not on file   Years of education: Not on file   Highest education level: Not on file  Occupational History   Not on file  Tobacco Use   Smoking status: Never    Passive exposure: Never   Smokeless tobacco: Never  Vaping Use   Vaping status: Never Used  Substance and Sexual Activity   Alcohol use: No    Alcohol/week: 0.0 standard drinks of alcohol   Drug use: No   Sexual activity: Not on file  Other Topics Concern   Not on file  Social History Narrative   Not on file   Social Drivers of Health   Financial Resource Strain: Not on file  Food Insecurity: Not on file  Transportation Needs: Not on file  Physical Activity: Not on file  Stress: Not on file  Social  Connections: Not on file     Family History: The patient's family history includes Liver cancer in his father. ROS:   Please see the history of present illness.    All 14 point review of systems negative except as described per history of present illness  EKGs/Labs/Other Studies Reviewed:    EKG Interpretation Date/Time:  Tuesday December 22 2023 15:20:32 EDT Ventricular Rate:  77 PR Interval:  150 QRS Duration:  100 QT Interval:  384 QTC Calculation: 434 R Axis:   74  Text Interpretation: Normal sinus rhythm Normal ECG No previous ECGs available Confirmed by Bernie Charleston [phone]) on 12/22/2023 3:45:44 PM    Recent Labs: No results found for requested labs within last 365 days.  Recent Lipid Panel No results found for: CHOL, TRIG, HDL, CHOLHDL,  VLDL, LDLCALC, LDLDIRECT  Physical Exam:    VS:  BP 118/70 (BP Location: Left Arm, Patient Position: Sitting)   Pulse 77   Ht 5' 8 (1.727 m)   Wt 123 lb (55.8 kg)   SpO2 98%   BMI 18.70 kg/m     Wt Readings from Last 3 Encounters:  12/22/23 123 lb (55.8 kg)  08/05/22 131 lb (59.4 kg)  06/25/21 123 lb 1.9 oz (55.8 kg)     GEN:  Well nourished, well developed in no acute distress HEENT: Normal NECK: No JVD; No carotid bruits LYMPHATICS: No lymphadenopathy CARDIAC: RRR, systolic ejection murmur grade 2 through 3/6 pressure right upper portion of the sternum, no rubs, no gallops RESPIRATORY:  Clear to auscultation without rales, wheezing or rhonchi  ABDOMEN: Soft, non-tender, non-distended MUSCULOSKELETAL:  No edema; No deformity  SKIN: Warm and dry LOWER EXTREMITIES: no swelling NEUROLOGIC:  Alert and oriented x 3 PSYCHIATRIC:  Normal affect   ASSESSMENT:    1. Nonrheumatic aortic valve stenosis   2. Obstructive bronchiectasis (HCC)   3. Mixed hyperlipidemia    PLAN:    In order of problems listed above:  Aortic stenosis, will repeat echocardiogram to recheck on the valve 1 more time I explained to him signs and symptoms of critical aortic stenosis which will be shortness of breath chest pain dizziness passing out.  Asking to let me know if he develops those. Obstructive bronchiectasis.  Stable. Mixed dyslipidemia I do have data from November of last year I did review K PN for the LDL 136 HDL 47 obviously not well-controlled but he does not want to take any medications for it he did a lot of diet changes hopefully that will help. We did talk about healthy lifestyle I reviewed he needs to exercise on the regular basis which he does   Medication Adjustments/Labs and Tests Ordered: Current medicines are reviewed at length with the patient today.  Concerns regarding medicines are outlined above.  Orders Placed This Encounter  Procedures   EKG 12-Lead   Medication  changes: No orders of the defined types were placed in this encounter.   Signed, [place] DOROTHA Bernie, MD, [facility] 12/22/2023 3:55 PM    [facility]

## 2024-02-02 ENCOUNTER — Ambulatory Visit (HOSPITAL_COMMUNITY)
Admission: RE | Admit: 2024-02-02 | Discharge: 2024-02-02 | Disposition: A | Discharge status: Home / Self Care | Source: Ambulatory Visit | Attending: Cardiology | Admitting: Cardiology

## 2024-02-02 DIAGNOSIS — R0609 Other forms of dyspnea: Secondary | ICD-10-CM | POA: Diagnosis present

## 2024-02-02 LAB — ECHOCARDIOGRAM COMPLETE
AR max vel: 0.85 cm2
AV Area VTI: 1.12 cm2
AV Area mean vel: 1.1 cm2
AV Mean grad: 12.8 mmHg
AV Peak grad: 33.6 mmHg
Ao pk vel: 2.9 m/s
S' Lateral: 2.85 cm

## 2024-02-05 ENCOUNTER — Ambulatory Visit: Payer: Self-pay | Admitting: Cardiology

## 2024-02-08 ENCOUNTER — Telehealth: Payer: Self-pay

## 2024-02-08 NOTE — Telephone Encounter (Signed)
 Left message on My Chart with Echo results per Dr. Karry note. Routed to PCP.
